# Patient Record
Sex: Female | Born: 1995 | Race: Black or African American | Hispanic: No | Marital: Single
Health system: Midwestern US, Community
[De-identification: ages and names within clinical notes are randomized; demographics above are authoritative.]

## PROBLEM LIST (undated history)

## (undated) ENCOUNTER — Ambulatory Visit (HOSPITAL_BASED_OUTPATIENT_CLINIC_OR_DEPARTMENT_OTHER)

## (undated) DIAGNOSIS — R519 Headache, unspecified: Secondary | ICD-10-CM

## (undated) DIAGNOSIS — D649 Anemia, unspecified: Secondary | ICD-10-CM

## (undated) DIAGNOSIS — F419 Anxiety disorder, unspecified: Secondary | ICD-10-CM

## (undated) DIAGNOSIS — R87629 Unspecified abnormal cytological findings in specimens from vagina: Secondary | ICD-10-CM

## (undated) DIAGNOSIS — F32A Depression, unspecified: Secondary | ICD-10-CM

## (undated) DIAGNOSIS — O26892 Other specified pregnancy related conditions, second trimester: Secondary | ICD-10-CM

## (undated) DIAGNOSIS — O26893 Other specified pregnancy related conditions, third trimester: Secondary | ICD-10-CM

## (undated) DIAGNOSIS — O4403 Placenta previa specified as without hemorrhage, third trimester: Secondary | ICD-10-CM

## (undated) HISTORY — DX: Headache, unspecified: R51.9

## (undated) HISTORY — DX: Anemia, unspecified: D64.9

## (undated) HISTORY — DX: Depression, unspecified: F32.A

## (undated) HISTORY — DX: Unspecified abnormal cytological findings in specimens from vagina: R87.629

## (undated) HISTORY — DX: Anxiety disorder, unspecified: F41.9

---

## 2017-12-11 ENCOUNTER — Inpatient Hospital Stay
Admit: 2017-12-11 | Discharge: 2017-12-12 | Payer: Self-pay | Attending: Student in an Organized Health Care Education/Training Program

## 2017-12-11 DIAGNOSIS — O26851 Spotting complicating pregnancy, first trimester: Secondary | ICD-10-CM

## 2017-12-11 NOTE — ED Notes (Signed)
Patient is [redacted] weeks pregnant with complains of cramping and spotting that started today.  Lmp was Sept 13.

## 2017-12-11 NOTE — ED Provider Notes (Signed)
22 year old [id number] pregnant female at 7 weeks and 2 days presents via private auto for complaint of pelvic cramping and vaginal bleeding.  She reports bleeding started today and has been intermittent.  She reports associated pelvic pain that she describes as "mild cramping" located in the suprapubic region.  She reports severity of pain as "7/10" currently and "7/10" at its worst.  She reports vaginal bleeding described as "light pink spotting."  She reports onset of symptoms as gradual, and reports bleeding has been intermittent.      She reports symptoms are aggravated by nothing.  She reports symptoms are improved with nothing.  She denies urinary symptoms.  She denies any associated fever, nausea, vomiting, abdominal pain, chest pain, vaginal discharge, vaginal lesions, or vaginal itching.    She reports one prior pregnancy that ended in miscarriage in the first trimester.  She denies prior sexually transmitted infections.  She denies prior pelvic inflammatory disease.  She denies fertility treatments.  She denies prior ectopic pregnancy.             No past medical history on file.    No past surgical history on file.      No family history on file.    Social History     Socioeconomic History   ??? Marital status: Not on file     Spouse name: Not on file   ??? Number of children: Not on file   ??? Years of education: Not on file   ??? Highest education level: Not on file   Occupational History   ??? Not on file   Social Needs   ??? Financial resource strain: Not on file   ??? Food insecurity:     Worry: Not on file     Inability: Not on file   ??? Transportation needs:     Medical: Not on file     Non-medical: Not on file   Tobacco Use   ??? Smoking status: Not on file   Substance and Sexual Activity   ??? Alcohol use: Not on file   ??? Drug use: Not on file   ??? Sexual activity: Not on file   Lifestyle   ??? Physical activity:     Days per week: Not on file     Minutes per session: Not on file   ??? Stress: Not on file    Relationships   ??? Social connections:     Talks on phone: Not on file     Gets together: Not on file     Attends religious service: Not on file     Active member of club or organization: Not on file     Attends meetings of clubs or organizations: Not on file     Relationship status: Not on file   ??? Intimate partner violence:     Fear of current or ex partner: Not on file     Emotionally abused: Not on file     Physically abused: Not on file     Forced sexual activity: Not on file   Other Topics Concern   ??? Not on file   Social History Narrative   ??? Not on file         ALLERGIES: Sulfa (sulfonamide antibiotics)    Review of Systems   Constitutional: Negative for chills, fatigue and fever.   HENT: Negative for congestion, rhinorrhea, sinus pressure, sinus pain and sore throat.    Eyes: Negative for pain, redness and visual disturbance.  Respiratory: Negative for cough and shortness of breath.    Cardiovascular: Negative for chest pain, palpitations and leg swelling.   Gastrointestinal: Negative for abdominal pain, constipation, diarrhea, nausea and vomiting.   Genitourinary: Positive for pelvic pain and vaginal bleeding. Negative for difficulty urinating, dysuria, flank pain, frequency, hematuria, vaginal discharge and vaginal pain.   Musculoskeletal: Negative for back pain and gait problem.   Skin: Negative for color change and rash.   Neurological: Negative for weakness, numbness and headaches.   Psychiatric/Behavioral: Negative for confusion.       Vitals:    12/11/17 1848 12/11/17 1903   BP:  114/64   Pulse:  70   Resp:  18   Temp:  99.2 ??F (37.3 ??C)   SpO2:  100%   Weight: 63.5 kg (140 lb)    Height: 5\' 4"  (1.626 m)             Physical Exam   Constitutional: She is oriented to person, place, and time. She appears well-developed and well-nourished. No distress.   HENT:   Head: Normocephalic and atraumatic.   Nose: Nose normal.   Mouth/Throat: Oropharynx is clear and moist.    Eyes: Pupils are equal, round, and reactive to light. Conjunctivae and EOM are normal. No scleral icterus.   Neck: Normal range of motion. Neck supple.   Cardiovascular: Normal rate and regular rhythm.   No murmur heard.  Pulmonary/Chest: Effort normal and breath sounds normal. No respiratory distress.   Abdominal: Soft. Bowel sounds are normal. She exhibits no distension. There is no tenderness.   Musculoskeletal: She exhibits no edema or deformity.   Neurological: She is alert and oriented to person, place, and time.   Skin: Skin is warm and dry. Capillary refill takes less than 2 seconds. No rash noted.   Nursing note and vitals reviewed.     Results Review    No results found for this or any previous visit (from the past 12 hour(s)).    MDM  Number of Diagnoses or Management Options  Diagnosis management comments: This is a 22 year old [id number] pregnant female at 7 weeks and 2 days who presented for complaint of mild pelvic cramping and vaginal bleeding she described as spotting who eloped prior to completion of her physical exam.  We had discussed performing a pelvic exam to evaluate for open cervix, and performing ultrasound to evaluate her pregnancy.  Patient reports having had an ultrasound at her OB appointment 3 days prior that demonstrated an intrauterine pregnancy.  Ectopic pregnancy was felt to be unlikely given her previously noted intrauterine pregnancy, but we were unable to further evaluate due to her having eloped from the ED prior to completion of medical examination.

## 2017-12-11 NOTE — ED Notes (Signed)
Patient not in room, charge nurse made aware.

## 2017-12-11 NOTE — ED Triage Notes (Signed)
Patient is [redacted] weeks pregnant with complains of cramping and spotting that started today.  Lmp was Sept 13.

## 2017-12-11 NOTE — ED Provider Notes (Signed)
22 year old [id number] pregnant female at 7 weeks and 2 days presents via private auto for complaint of pelvic cramping and vaginal bleeding.  She reports bleeding started today and has been intermittent.  She reports associated pelvic pain that she describes as "mild cramping" located in the suprapubic region.  She reports severity of pain as "7/10" currently and "7/10" at its worst.  She reports vaginal bleeding described as "light pink spotting."  She reports onset of symptoms as gradual, and reports bleeding has been intermittent.      She reports symptoms are aggravated by nothing.  She reports symptoms are improved with nothing.  She denies urinary symptoms.  She denies any associated fever, nausea, vomiting, abdominal pain, chest pain, vaginal discharge, vaginal lesions, or vaginal itching.    She reports one prior pregnancy that ended in miscarriage in the first trimester.  She denies prior sexually transmitted infections.  She denies prior pelvic inflammatory disease.  She denies fertility treatments.  She denies prior ectopic pregnancy.             No past medical history on file.    No past surgical history on file.      No family history on file.    Social History     Socioeconomic History   ??? Marital status: Not on file     Spouse name: Not on file   ??? Number of children: Not on file   ??? Years of education: Not on file   ??? Highest education level: Not on file   Occupational History   ??? Not on file   Social Needs   ??? Financial resource strain: Not on file   ??? Food insecurity:     Worry: Not on file     Inability: Not on file   ??? Transportation needs:     Medical: Not on file     Non-medical: Not on file   Tobacco Use   ??? Smoking status: Not on file   Substance and Sexual Activity   ??? Alcohol use: Not on file   ??? Drug use: Not on file   ??? Sexual activity: Not on file   Lifestyle   ??? Physical activity:     Days per week: Not on file     Minutes per session: Not on file   ??? Stress: Not on file   Relationships    ??? Social connections:     Talks on phone: Not on file     Gets together: Not on file     Attends religious service: Not on file     Active member of club or organization: Not on file     Attends meetings of clubs or organizations: Not on file     Relationship status: Not on file   ??? Intimate partner violence:     Fear of current or ex partner: Not on file     Emotionally abused: Not on file     Physically abused: Not on file     Forced sexual activity: Not on file   Other Topics Concern   ??? Not on file   Social History Narrative   ??? Not on file         ALLERGIES: Sulfa (sulfonamide antibiotics)    Review of Systems   Constitutional: Negative for chills, fatigue and fever.   HENT: Negative for congestion, rhinorrhea, sinus pressure, sinus pain and sore throat.    Eyes: Negative for pain, redness and visual disturbance.  Respiratory: Negative for cough and shortness of breath.    Cardiovascular: Negative for chest pain, palpitations and leg swelling.   Gastrointestinal: Negative for abdominal pain, constipation, diarrhea, nausea and vomiting.   Genitourinary: Positive for pelvic pain and vaginal bleeding. Negative for difficulty urinating, dysuria, flank pain, frequency, hematuria, vaginal discharge and vaginal pain.   Musculoskeletal: Negative for back pain and gait problem.   Skin: Negative for color change and rash.   Neurological: Negative for weakness, numbness and headaches.   Psychiatric/Behavioral: Negative for confusion.       Vitals:    12/11/17 1848 12/11/17 1903   BP:  114/64   Pulse:  70   Resp:  18   Temp:  99.2 ??F (37.3 ??C)   SpO2:  100%   Weight: 63.5 kg (140 lb)    Height: 5\' 4"  (1.626 m)             Physical Exam   Constitutional: She is oriented to person, place, and time. She appears well-developed and well-nourished. No distress.   HENT:   Head: Normocephalic and atraumatic.   Nose: Nose normal.   Mouth/Throat: Oropharynx is clear and moist.   Eyes: Pupils are equal, round, and reactive to  light. Conjunctivae and EOM are normal. No scleral icterus.   Neck: Normal range of motion. Neck supple.   Cardiovascular: Normal rate and regular rhythm.   No murmur heard.  Pulmonary/Chest: Effort normal and breath sounds normal. No respiratory distress.   Abdominal: Soft. Bowel sounds are normal. She exhibits no distension. There is no tenderness.   Musculoskeletal: She exhibits no edema or deformity.   Neurological: She is alert and oriented to person, place, and time.   Skin: Skin is warm and dry. Capillary refill takes less than 2 seconds. No rash noted.   Nursing note and vitals reviewed.     Results Review    No results found for this or any previous visit (from the past 12 hour(s)).    MDM  Number of Diagnoses or Management Options  Diagnosis management comments: This is a 22 year old [id number] pregnant female at 7 weeks and 2 days who presented for complaint of mild pelvic cramping and vaginal bleeding she described as spotting who eloped prior to completion of her physical exam.  We had discussed performing a pelvic exam to evaluate for open cervix, and performing ultrasound to evaluate her pregnancy.  Patient reports having had an ultrasound at her OB appointment 3 days prior that demonstrated an intrauterine pregnancy.  Ectopic pregnancy was felt to be unlikely given her previously noted intrauterine pregnancy, but we were unable to further evaluate due to her having eloped from the ED prior to completion of medical examination.

## 2017-12-11 NOTE — ED Notes (Signed)
Patient not in room, charge nurse made aware.

## 2017-12-24 ENCOUNTER — Encounter: Primary: Internal Medicine

## 2017-12-25 ENCOUNTER — Encounter: Primary: Internal Medicine

## 2017-12-29 LAB — RUBELLA AB, IGG, EXTERNAL
RUBELLA, EXTERNAL: IMMUNE
Rubella, External: IMMUNE

## 2017-12-29 LAB — ANTIBODY SCREEN, EXTERNAL
ANTIBODY SCREEN, EXTERNAL: NEGATIVE
Antibody screen, External: NEGATIVE

## 2017-12-29 LAB — RPR, EXTERNAL

## 2017-12-29 LAB — N GONORRHOEAE, DNA PROBE, EXTERNAL
Gonorrhea, External: NEGATIVE
N. GONORRHEA, EXTERNAL: NEGATIVE

## 2017-12-29 LAB — HEPATITIS B SURFACE AG, EXTERNAL
HBSAG, EXTERNAL: NEGATIVE
HBsAg, External: NEGATIVE

## 2017-12-29 LAB — TYPE, ABO & RH, EXTERNAL

## 2017-12-29 LAB — CHLAMYDIA DNA PROBE, EXTERNAL
CHLAMYDIA, EXTERNAL: NEGATIVE
Chlamydia, External: NEGATIVE

## 2017-12-29 LAB — HIV-1 AB, EXTERNAL

## 2018-04-04 ENCOUNTER — Inpatient Hospital Stay: Payer: MEDICAID

## 2018-04-04 NOTE — Other (Addendum)
1814 This G2P0 patietn of Dr. Elvis Coil ambulates to the unit at 23.[redacted] wk EGA with c/o lower abdominal pain and decreased fetal movement x 2 days ago. Patient denies vaginal bleeding or leaking of fluids. Denies headache, visual changes, epigastric pain, or sudden increase in swelling.     1816 Phone call to Dr.Jordan. Notified of patient arrival and complaint. Telephone order with read back for fetal heart rate, UA, and SVE. Dr.Jordan aware that patient has placenta previa. MD states "You are not going to go through the cervix I hope. You can gently check her. I need to know if she is dilated." Discussed with charge nurse and charge nurse agrees that patient should not have SVE. This RN calls provider back and notifies of this. Provider gives telephone order with read back to call her after UA results are back.    1905 Phone call to Dr.Jordan with negative UA results. No ctx's observed on tracing or with palpation. No vaginal bleeding noted by this RN or reported by patient. Patient does mention that she does a lot of bending and lifting at work which makes her very sore that night and into the following day. Patient is advised to obtain support belt and speak with Dr.Jordan at appt regarding this. Dr.Jordan also aware of this. Per telephone order w/ readback from Dr.Jordan patient to be d/c'd home at this time on pelvic rest and is to keep PB appt on Friday unless sxs worsen before that time.     1915 D/C instructions, fetal kick counts, round ligament pain, and follow up reviewed with patient and her mother. Written copy given. Patient denies questions/concerns at this time.     1919 Patient ambulates off unit at this time

## 2018-04-05 LAB — URINALYSIS W/ RFLX MICROSCOPIC
Bilirubin, Urine: NEGATIVE
Bilirubin: NEGATIVE
Blood, Urine: NEGATIVE
Blood: NEGATIVE
Glucose, Ur: NEGATIVE mg/dl
Glucose: NEGATIVE mg/dl
Ketone: NEGATIVE mg/dl
Ketones, Urine: NEGATIVE mg/dl
Nitrite, Urine: NEGATIVE
Nitrites: NEGATIVE
Protein, UA: NEGATIVE mg/dl
Protein: NEGATIVE mg/dl
Specific Gravity, UA: 1.02 (ref 1.005–1.030)
Specific gravity: 1.02 (ref 1.005–1.030)
Urobilinogen, UA, POCT: 0.2 mg/dl (ref 0.0–1.0)
Urobilinogen: 0.2 mg/dl (ref 0.0–1.0)
pH (UA): 7 (ref 5.0–9.0)
pH, UA: 7 (ref 5.0–9.0)

## 2018-04-05 LAB — POC URINE MICROSCOPIC

## 2018-07-06 ENCOUNTER — Ambulatory Visit: Admit: 2018-07-06 | Payer: PRIVATE HEALTH INSURANCE | Primary: Internal Medicine

## 2018-07-06 ENCOUNTER — Inpatient Hospital Stay: Payer: PRIVATE HEALTH INSURANCE

## 2018-07-06 LAB — URINALYSIS W/ RFLX MICROSCOPIC
Bilirubin, Urine: NEGATIVE
Bilirubin: NEGATIVE
Blood, Urine: NEGATIVE
Blood: NEGATIVE
Glucose, Ur: NEGATIVE mg/dl
Glucose: NEGATIVE mg/dl
Ketone: NEGATIVE mg/dl
Ketones, Urine: NEGATIVE mg/dl
Nitrite, Urine: NEGATIVE
Nitrites: NEGATIVE
Protein, UA: NEGATIVE mg/dl
Protein: NEGATIVE mg/dl
Specific Gravity, UA: 1.015 (ref 1.005–1.030)
Specific gravity: 1.015 (ref 1.005–1.030)
Urobilinogen, UA, POCT: 0.2 mg/dl (ref 0.0–1.0)
Urobilinogen: 0.2 mg/dl (ref 0.0–1.0)
pH (UA): 7 (ref 5.0–9.0)
pH, UA: 7 (ref 5.0–9.0)

## 2018-07-06 LAB — POC URINE MICROSCOPIC

## 2018-07-06 MED ORDER — DEXTROSE 5%-LACTATED RINGERS IV
Freq: Once | INTRAVENOUS | Status: AC
Start: 2018-07-06 — End: 2018-07-06
  Administered 2018-07-06: 07:00:00 via INTRAVENOUS

## 2018-07-06 MED ORDER — DEXTROSE 5%-LACTATED RINGERS IV
INTRAVENOUS | Status: DC
Start: 2018-07-06 — End: 2018-07-06
  Administered 2018-07-06: 08:00:00 via INTRAVENOUS

## 2018-07-06 MED FILL — DEXTROSE 5%-LACTATED RINGERS IV: INTRAVENOUS | Qty: 1000

## 2018-07-06 NOTE — Progress Notes (Signed)
 Pt presents to triage c/o sharp lower abdominal cramping since 0000 complete previa due for recheck US . .  Pt is a G2 P0010  [redacted]w[redacted]d  unknown     Visit Vitals  Temp 98.8 F (37.1 C)   Ht 5' 4 (1.626 m)   Wt 78.9 kg (174 lb)   BMI 29.87 kg/m     Allergies   Allergen Reactions   . Sulfa (Sulfonamide Antibiotics) Rash     Patient Vitals for the past 4 hrs:   Mode Fetal Heart Rate Fetal Activity Variability Decelerations Accelerations Non Stress Test   07/06/18 0125 External 135 Present 6-25 BPM None Yes Reactive     Uterine Activity  Mode: External  Frequency (min): 1-7  Duration (sec): 70-80  Intensity: Mild  Uterine Resting Tone: Relaxed         DrGrimes provided SBAR report.   0122 New orders received. UA, transvaginaL US , IV bolus 1L D5LR, check pt if previa has moved.  0141 UA sent to lab.  0145-US  in room.  0153-Pt up to the bathroom.  0210-US  completed  0225 Pt up to the bathroom.  0330-Provider on his way to the bedside, discussed variable decel, after long ctx, increased pain after US  and UA results.  [id number]-Provider at the bedside reviewed FHT, plan to bolus D5LR, then may be discharged pt to keep appt in office.

## 2018-07-06 NOTE — Progress Notes (Addendum)
Pt presents to triage c/o sharp lower abdominal cramping since 0000 complete previa due for recheck US. .  Pt is a G2 P0010  [redacted]w[redacted]d  unknown     Visit Vitals  Temp 98.8 ??F (37.1 ??C)   Ht 5' 4" (1.626 m)   Wt 78.9 kg (174 lb)   BMI 29.87 kg/m??     Allergies   Allergen Reactions   ??? Sulfa (Sulfonamide Antibiotics) Rash     Patient Vitals for the past 4 hrs:   Mode Fetal Heart Rate Fetal Activity Variability Decelerations Accelerations Non Stress Test   07/06/18 0125 External 135 Present 6-25 BPM None Yes Reactive     Uterine Activity  Mode: External  Frequency (min): 1-7  Duration (sec): 70-80  Intensity: Mild  Uterine Resting Tone: Relaxed         DrGrimes provided SBAR report.   0122 New orders received. UA, transvaginaL US, IV bolus 1L D5LR, check pt if previa has moved.  0141 UA sent to lab.  0145-US in room.  0153-Pt up to the bathroom.  0210-US completed  0225 Pt up to the bathroom.  0330-Provider on his way to the bedside, discussed variable decel, after long ctx, increased pain after US and UA results.  00355-Provider at the bedside reviewed FHT, plan to bolus D5LR, then may be discharged pt to keep appt in office.

## 2018-07-06 NOTE — Progress Notes (Signed)
Pt discharged in ambulatory condition with written instructions and labor precautions provided. Pt given opportunity to ask questions and voice concerns and denies any at this time. Pt encouraged to return to OB dept with changes in condition requiring further treatment.

## 2018-07-06 NOTE — Progress Notes (Signed)
Antepartum Progress Note  Patient presented with sharp abdominal pain and cramping since midnight.  She has been fluid hydrated and bedside sonogram in hospital tonight reviews a posterior fundal marginal placenta 1.9cm from cervical os.  She denies any vaginal bleeding or loss of fluid.   seen, fetal heart rate and contraction pattern evaluated, patient examined.  No data found.    Physical Exam:  Cervical Exam:  0 cm dilated    50% effaced    -3 station    Presenting Part: cephalic  Membranes:  Intact  Uterine Activity: irritability currently  Fetal Heart Rate: Baseline: 140 per minute  Variability: moderate  Accelerations: yes    Assessment/Plan:  23 y/o G2P0010 @ 36.6 wks with marginal placenta and contractions  - complete 2nd bag of IV Fluids  - Discharge home   - pt will F/U at scheduled OB visit and Formal OB sonogram in office next week.

## 2018-07-06 NOTE — Progress Notes (Signed)
Pt here for c/o cramping.

## 2018-07-06 NOTE — Progress Notes (Signed)
Progress Notes by Elvera Maria, MD at 07/06/18 0406                Author: Elvera Maria, MD  Service: Obstetrics & Gynecology  Author Type: Physician       Filed: 07/06/18 0414  Date of Service: 07/06/18 0406  Status: Signed          Editor: Elvera Maria, MD (Physician)                                Antepartum Progress Note   Patient presented with sharp abdominal pain and cramping since midnight.  She has been fluid hydrated and bedside sonogram in hospital tonight reviews a posterior fundal marginal placenta  1.9cm from cervical os.  She denies any vaginal bleeding or loss of fluid.   seen, fetal heart rate and contraction pattern evaluated, patient examined.   No data found.      Physical Exam:   Cervical Exam:  0 cm dilated     50% effaced     -3 station     Presenting Part: cephalic   Membranes:  Intact   Uterine Activity: irritability currently   Fetal Heart Rate: Baseline: 140 per minute   Variability: moderate   Accelerations: yes      Assessment/Plan:   23 y/o G2P0010 @ 36.6 wks with marginal placenta and contractions   - complete 2nd bag of IV Fluids   - Discharge home    - pt will F/U at scheduled OB visit and Formal OB sonogram in office next week.

## 2018-07-09 ENCOUNTER — Inpatient Hospital Stay: Payer: PRIVATE HEALTH INSURANCE

## 2018-07-09 ENCOUNTER — Ambulatory Visit: Admit: 2018-07-09 | Payer: PRIVATE HEALTH INSURANCE | Primary: Internal Medicine

## 2018-07-09 LAB — ANTIBODY SCREEN
Antibody Screen: NEGATIVE
Antibody screen: NEGATIVE

## 2018-07-09 LAB — CBC WITH AUTO DIFFERENTIAL
Basophils %: 0.2 % (ref 0–3)
Eosinophils %: 0.4 % (ref 0–5)
Hematocrit: 34.9 % — ABNORMAL LOW (ref 37.0–50.0)
Hemoglobin: 11.2 gm/dl — ABNORMAL LOW (ref 13.0–17.2)
Immature Granulocytes %: 0.8 % (ref 0.0–3.0)
Lymphocytes %: 14.2 % — ABNORMAL LOW (ref 28–48)
MCH: 27.1 pg (ref 25.4–34.6)
MCHC: 32.1 gm/dl (ref 30.0–36.0)
MCV: 84.3 fL (ref 80.0–98.0)
MPV: 10.4 fL — ABNORMAL HIGH (ref 6.0–10.0)
Monocytes %: 7.5 % (ref 1–13)
Neutrophils %: 76.9 % — ABNORMAL HIGH (ref 34–64)
Nucleated RBCs: 0 (ref 0–0)
Platelets: 169 10*3/uL (ref 140–450)
RBC: 4.14 M/uL (ref 3.60–5.20)
RDW-SD: 43.1 (ref 36.4–46.3)
WBC: 13.9 10*3/uL — ABNORMAL HIGH (ref 4.0–11.0)

## 2018-07-09 LAB — URINALYSIS W/ RFLX MICROSCOPIC
Bilirubin, Urine: NEGATIVE
Bilirubin: NEGATIVE
Glucose, Ur: NEGATIVE mg/dl
Glucose: NEGATIVE mg/dl
Ketone: NEGATIVE mg/dl
Ketones, Urine: NEGATIVE mg/dl
Nitrite, Urine: NEGATIVE
Nitrites: NEGATIVE
Specific Gravity, UA: 1.015 (ref 1.005–1.030)
Specific gravity: 1.015 (ref 1.005–1.030)
Urobilinogen, UA, POCT: 0.2 mg/dl (ref 0.0–1.0)
Urobilinogen: 0.2 mg/dl (ref 0.0–1.0)
pH (UA): 7 (ref 5.0–9.0)
pH, UA: 7 (ref 5.0–9.0)

## 2018-07-09 LAB — POC URINE MICROSCOPIC

## 2018-07-09 LAB — BLOOD TYPE, (ABO+RH)
ABO/Rh(D): O POS
ABO/Rh: O POS

## 2018-07-09 LAB — CBC WITH AUTOMATED DIFF
BASOPHILS: 0.2 % (ref 0–3)
EOSINOPHILS: 0.4 % (ref 0–5)
HCT: 34.9 % — ABNORMAL LOW (ref 37.0–50.0)
HGB: 11.2 gm/dl — ABNORMAL LOW (ref 13.0–17.2)
IMMATURE GRANULOCYTES: 0.8 % (ref 0.0–3.0)
LYMPHOCYTES: 14.2 % — ABNORMAL LOW (ref 28–48)
MCH: 27.1 pg (ref 25.4–34.6)
MCHC: 32.1 gm/dl (ref 30.0–36.0)
MCV: 84.3 fL (ref 80.0–98.0)
MONOCYTES: 7.5 % (ref 1–13)
MPV: 10.4 fL — ABNORMAL HIGH (ref 6.0–10.0)
NEUTROPHILS: 76.9 % — ABNORMAL HIGH (ref 34–64)
NRBC: 0 (ref 0–0)
PLATELET: 169 10*3/uL (ref 140–450)
RBC: 4.14 M/uL (ref 3.60–5.20)
RDW-SD: 43.1 (ref 36.4–46.3)
WBC: 13.9 10*3/uL — ABNORMAL HIGH (ref 4.0–11.0)

## 2018-07-09 MED ORDER — LACTATED RINGERS IV
INTRAVENOUS | Status: DC
Start: 2018-07-09 — End: 2018-07-09
  Administered 2018-07-09: 12:00:00 via INTRAVENOUS

## 2018-07-09 MED ORDER — ACETAMINOPHEN 1,000 MG/100 ML (10 MG/ML) IV
1000 mg/100 mL (10 mg/mL) | INTRAVENOUS | Status: AC
Start: 2018-07-09 — End: 2018-07-09
  Administered 2018-07-09: 11:00:00 via INTRAVENOUS

## 2018-07-09 MED ORDER — TERBUTALINE 1 MG/ML SUB-Q
1 mg/mL | SUBCUTANEOUS | Status: AC | PRN
Start: 2018-07-09 — End: 2018-07-09
  Administered 2018-07-09 (×2): via SUBCUTANEOUS

## 2018-07-09 MED FILL — OFIRMEV 1,000 MG/100 ML (10 MG/ML) INTRAVENOUS SOLUTION: 1000 mg/100 mL (10 mg/mL) | INTRAVENOUS | Qty: 100

## 2018-07-09 MED FILL — LACTATED RINGERS IV: INTRAVENOUS | Qty: 1000

## 2018-07-09 MED FILL — TERBUTALINE 1 MG/ML SUB-Q: 1 mg/mL | SUBCUTANEOUS | Qty: 1

## 2018-07-09 NOTE — Other (Addendum)
1240 EFM and TOCO removed. Patient transported to ultrasound at this time    1358 Patient returns to unit from ultrasound. BPP 8/8, posterior marginal placenta 2cm from internal os per report. LMOM for Dr.Butts to call unit for results    1410 Dr.Butts at bedside and discusses ultrasound results with patient and mother. Patient would like to have SVD vs C/S. SVE unchanged at this time. Patient scheduled for OB follow up in office tomorrow. Verbal order with read back recv'd to d/c home with labor precautions at this time.     1420 D/C instructions, labor precautions, fetal kick counts, and labor precautions reviewed with patient and mother. Written copy given to patient.     1430 Patient ambulates off unit at this time.

## 2018-07-09 NOTE — Other (Signed)
g2p0 at 37.2 wks arrived with c/o ctx that woke her up around 0430. Pt is a complete previa and was here 3 days ago for ctx as well but these are worse. Pt taken to 3115 and changed and voided. Pt then placed in high fowlers and monitors applied. Pt denies any vag bleeding. Admission assessment completed. Pt is very uncomfortable and breathing through some of her ctx. md to be called.

## 2018-07-09 NOTE — Other (Signed)
Dr Margie Ege called and made that this g2p0 at 37.2 wks arrived with c/o ctx. Pt is a complete previa and is ctxing q 2-3 min. No vag bleeding noted. Pt was here 3 days ago for the same reason and was hydrated and sent home. Orders rec'd to give the pt a dose of terb and may repeat times one. rn asked if he would like an iv and he stated "not at the moment".

## 2018-07-09 NOTE — Other (Signed)
Pt states that her ctx have spaced out a little bit, pt is still very uncomfortable and breathing through her ctx.

## 2018-07-09 NOTE — Other (Signed)
Dr butts called and made aware that this g2p0 at 37.2 wks complete previa arrived ctxing q3-4 min so dr Margie Ege gave her 2 doses of terb and she is now cting q 3-5 min. md asked where the rn got that the pt was a complete previa and this rn explained that the pt stated she was. md stated that the last note from dr grimes was that the pt had a marginal previa and she was to be scanned again tomorrow. md asked if the pt was in pain and rn explained that the pt is uncomfortable and breathing with some ctx. Orders rec'd to give a liter iv bolus and then to run at 125cc/hr and to send a urine, get a cbc, t&s and to give tylenol  1000mg  iv for pain. md will be in soon to see the pt.

## 2018-07-09 NOTE — Other (Signed)
Report given to dayshift rn

## 2018-07-09 NOTE — Other (Signed)
Dr.Butts at bedside for SVE, aware that patient has questionable placenta previa. SVE 0/50/-3

## 2018-07-09 NOTE — Other (Signed)
Phone call from Dr.Butts who states that she is unable to come to unit for repeat SVE at this time as planned. Telephone order with read back recvd for ultrasound for placenta placement and BPP.

## 2018-07-09 NOTE — H&P (Signed)
Obstetric Admission History and Physical    Name: Lauren Andrade MRN: [id number] SSN: [id number]    Date of Birth: May 14, 1995  Age: 23 y.o.  Sex: female       Subjective:      Chief complaint:    Chief Complaint   Patient presents with   ??? Contractions       Lauren Andrade is a 23 y.o.  female, G2P0 who presented with complaint of contractions. Pt complicated by complete previa noted previously in the pregnancy. She presented with same complaint 3 days ago and was found to have a marginal previa with 1.9 cm away from os noted on scan. Pt was fluid hydrated and discharged home. Notes today, contractions were more intense so she came back. She has not had any bleeding or leaking. She does note she is feeling baby move. After fluid hydration in antepartum today, her contractions are spaced and pt was able to rest. Repeat sonogram revealed placenta 2 cm away from os. Cervix closed x2 exam over 4 hours apart.     OB HISTORY  OB History     Gravida   2    Para        Term        Preterm        AB   1    Living           SAB   1    TAB        Ectopic        Molar        Multiple        Live Births                    PAST MEDICAL HISTORY  Past Medical History:   Diagnosis Date   ??? Placenta previa        PAST SURGICAL HISTORY  No past surgical history on file.    SOCIAL HISTORY  Social History     Socioeconomic History   ??? Marital status: SINGLE     Spouse name: Not on file   ??? Number of children: Not on file   ??? Years of education: 37   ??? Highest education level: Not on file   Occupational History   ??? Not on file   Social Needs   ??? Financial resource strain: Not on file   ??? Food insecurity     Worry: Not on file     Inability: Not on file   ??? Transportation needs     Medical: Not on file     Non-medical: Not on file   Tobacco Use   ??? Smoking status: Never Smoker   ??? Smokeless tobacco: Never Used   Substance and Sexual Activity   ??? Alcohol use: Not Currently     Frequency: Never   ??? Drug use: Never   ??? Sexual activity: Not on file    Lifestyle   ??? Physical activity     Days per week: Not on file     Minutes per session: Not on file   ??? Stress: Not on file   Relationships   ??? Social Wellsite geologist on phone: Not on file     Gets together: Not on file     Attends religious service: Not on file     Active member of club or organization: Not on file     Attends meetings of clubs or organizations: Not  on file     Relationship status: Not on file   ??? Intimate partner violence     Fear of current or ex partner: Not on file     Emotionally abused: Not on file     Physically abused: Not on file     Forced sexual activity: Not on file   Other Topics Concern   ??? Military Service Not Asked   ??? Blood Transfusions Not Asked   ??? Caffeine Concern Not Asked   ??? Occupational Exposure Not Asked   ??? Hobby Hazards Not Asked   ??? Sleep Concern Not Asked   ??? Stress Concern Not Asked   ??? Weight Concern Not Asked   ??? Special Diet Not Asked   ??? Back Care Not Asked   ??? Exercise Not Asked   ??? Bike Helmet Not Asked   ??? Seat Belt Not Asked   ??? Self-Exams Not Asked   Social History Narrative   ??? Not on file       FAMILY HISTORY  Family History   Problem Relation Age of Onset   ??? Hypertension Mother    ??? No Known Problems Father        ANTEPARTUM HISTORY: Pt presented to our practice for care initially in the first trimester. Prenatal course was uncomplicated/complicated by complete placenta previa which seems to have resolved.     ANTEPARTUM LABS:   Lab Results   Component Value Date/Time    ABO/Rh(D) O Rh Positive 07/09/2018 07:26 AM    HBsAg, External neg 12/29/2017    HIV, External nr 12/29/2017    Rubella, External immune 12/29/2017    RPR, External nr 12/29/2017    Gonorrhea, External neg 12/29/2017    Chlamydia, External neg 12/29/2017        ALLERGY:  Allergies   Allergen Reactions   ??? Sulfa (Sulfonamide Antibiotics) Rash       HOME MEDICATIONS:  Prior to Admission medications    Medication Sig Start Date End Date Taking? Authorizing Provider    iron,carb/vit C/vit B12/folic (IRON 100 PLUS PO) Take  by mouth.   Yes Provider, Historical   PNV No12-Iron-FA-DSS-OM-3 29 mg iron-1 mg -50 mg CPKD Take  by mouth.   Yes Provider, Historical   cholecalciferol (VITAMIN D3) (50,000 UNITS /1250 MCG) capsule Take  by mouth every seven (7) days.   Yes Provider, Historical        Review of Systems:  A comprehensive review of systems was negative except for: Constitutional:negative  Eyes: negative  Respiratory:negative  Cardiovascular: negative  Gastrointestinal: negative  Genitourinary: negative  Hematologic/lymphatic:negative  Musculoskeletal: negative  Neurological: negative  Behvioral/Psych: negative     Objective:     VITAL SIGNS:  Visit Vitals  BP 132/65   Pulse 91   Temp 98.3 ??F (36.8 ??C)   Resp 17   Ht 5\' 4"  (1.626 m)   Wt 78.9 kg (174 lb)   SpO2 99%   Breastfeeding No   BMI 29.87 kg/m??     Physical Exam:  Patient without distress.  Heart: normal rate  Lung: normal respiratory effort  Abdomen: soft, nontender, gravid uterus, ctx palpate moderate  External Genitalia: normal general appearance and normal general appearance without lesions  Cervix: Position: mid position, Dilation: 0cm and Thickness: 50%, -3 station  Extremities: extremities normal, atraumatic, no cyanosis or edema  Skin: Skin color, texture, turgor normal. No rashes or lesions  Neurologic: Alert and oriented X 3, normal strength and tone. Normal symmetric  reflexes. Normal coordination and gait    FHT: 150s bpm, cat1, ctx 2-3/10    Sonogram: 07/09/2018: FINDINGS:  Cervical length is approximately 3.3 cm. Placenta is posterior, 2 cm from the  internal os. Single intrauterine pregnancy, cephalic presentation. There is a  nuchal cord. Bladder, kidneys and stomach are unremarkable.  Fetal cardiac  activity is 143 bpm. Amniotic fluid index 14.3 cm, single deepest pocket 6.1 cm.  ??  Fetal tone 2  Fetal breathing 2  Fetal movement 2  Amniotic fluid volume 2  Total biophysical profile 8.  ??  IMPRESSION   IMPRESSION:  1. Marginal placenta, 2 cm from the internal os.  2. Suggestion of nuchal cord.  3. Total biophysical profile 8.    Assessment:     1) 37 weeks Intrauterine Pregnancy .  2) h/o marginal placenta previa      Plan:     Reassuring fetal status. Pt not in labor.   Repeat sonogram today confirms placenta previa resolved, noted to be 2 cm away from os.   Discussed finding with patient and her mothered. Reviewed that at this point, we have the option to proceed with vaginal delivery as placenta has moved. Discussed that since it is just on the border of normal, recommend continuing with same monitoring for vaginal bleeding. Discussed that at any point during labor, we may need to convert back to plan for c-section if she is noted to have bleeding that is deemed more than expected or concerning for danger to mother or baby. Pt voiced understanding and would like to proceed with this option as well. Precautions for labor reviewed. Pt will present for her OB visit in office tomorrow.       Signed By:  Murriel Hopper, MD     July 09, 2018   12:08 PM

## 2018-07-09 NOTE — H&P (Signed)
H&P by Murriel Hopper,  MD at 07/09/18 8469                Author: Murriel Hopper, MD  Service: Obstetrics & Gynecology  Author Type: Physician       Filed: 07/09/18 1745  Date of Service: 07/09/18 0815  Status: Signed          Editor: Murriel Hopper, MD (Physician)                             Obstetric Admission History and Physical          Name: Lauren Andrade  MRN: [id number]  SSN: [id number]          Date of Birth: [birth year]   Age: 23 y.o.   Sex: female            Subjective:         Chief complaint:       Chief Complaint       Patient presents with        ?  Contractions           Lauren Andrade is a 23 y.o.   female, G2P0 who presented with complaint of contractions. Pt complicated by complete previa noted previously in the pregnancy. She presented with same complaint 3 days ago and was found to have a marginal  previa with 1.9 cm away from os noted on scan. Pt was fluid hydrated and discharged home. Notes today, contractions were more intense so she came back. She has not had any bleeding or leaking. She does note she is feeling baby move. After fluid hydration  in antepartum today, her contractions are spaced and pt was able to rest. Repeat sonogram revealed placenta 2 cm away from os. Cervix closed x2 exam over 4 hours apart.       OB HISTORY     OB History                Gravida      2                Para                       Term                       Preterm                       AB      1                Living                                    SAB      1                TAB                       Ectopic                       Molar                       Multiple  Live Births                                           PAST MEDICAL HISTORY     Past Medical History:        Diagnosis  Date         ?  Placenta previa             PAST SURGICAL HISTORY   No past surgical history on file.      SOCIAL HISTORY     Social History          Socioeconomic History         ?  Marital status:  SINGLE               Spouse name:  Not on file         ?  Number of children:  Not on file     ?  Years of education:  112     ?  Highest education level:  Not on file       Occupational History        ?  Not on file       Social Needs         ?  Financial resource strain:  Not on file        ?  Food insecurity              Worry:  Not on file         Inability:  Not on file        ?  Transportation needs              Medical:  Not on file         Non-medical:  Not on file       Tobacco Use         ?  Smoking status:  Never Smoker     ?  Smokeless tobacco:  Never Used       Substance and Sexual Activity         ?  Alcohol use:  Not Currently              Frequency:  Never         ?  Drug use:  Never     ?  Sexual activity:  Not on file       Lifestyle        ?  Physical activity              Days per week:  Not on file         Minutes per session:  Not on file         ?  Stress:  Not on file       Relationships        ?  Social Engineer, manufacturing systemsconnections              Talks on phone:  Not on file         Gets together:  Not on file         Attends religious service:  Not on file         Active member of club or organization:  Not on file         Attends meetings of clubs or organizations:  Not on file  Relationship status:  Not on file        ?  Intimate partner violence              Fear of current or ex partner:  Not on file         Emotionally abused:  Not on file         Physically abused:  Not on file         Forced sexual activity:  Not on file        Other Topics  Concern         ?  Military Service  Not Asked     ?  Blood Transfusions  Not Asked     ?  Caffeine Concern  Not Asked     ?  Occupational Exposure  Not Asked     ?  Hobby Hazards  Not Asked     ?  Sleep Concern  Not Asked     ?  Stress Concern  Not Asked     ?  Weight Concern  Not Asked     ?  Special Diet  Not Asked     ?  Back Care  Not Asked     ?  Exercise  Not Asked     ?  Bike Helmet  Not Asked     ?  Seat Belt  Not Asked     ?  Self-Exams  Not Asked       Social  History Narrative        ?  Not on file           FAMILY HISTORY     Family History         Problem  Relation  Age of Onset          ?  Hypertension  Mother            ?  No Known Problems  Father             ANTEPARTUM HISTORY: Pt presented to our practice for care initially in the first trimester. Prenatal course was uncomplicated/complicated by complete placenta previa which seems to have resolved.       ANTEPARTUM LABS:      Lab Results         Component  Value  Date/Time            ABO/Rh(D)  O Rh Positive  07/09/2018 07:26 AM       HBsAg, External  neg  12/29/2017       HIV, External  nr  12/29/2017       Rubella, External  immune  12/29/2017       RPR, External  nr  12/29/2017       Gonorrhea, External  neg  12/29/2017            Chlamydia, External  neg  12/29/2017            ALLERGY:     Allergies        Allergen  Reactions         ?  Sulfa (Sulfonamide Antibiotics)  Rash           HOME MEDICATIONS:     Prior to Admission medications             Medication  Sig  Start Date  End Date  Taking?  Authorizing Provider  iron,carb/vit C/vit B12/folic (IRON 100 PLUS PO)  Take  by mouth.      Yes  Provider, Historical     PNV No12-Iron-FA-DSS-OM-3 29 mg iron-1 mg -50 mg CPKD  Take  by mouth.      Yes  Provider, Historical            cholecalciferol (VITAMIN D3) (50,000 UNITS /1250 MCG) capsule  Take  by mouth every seven (7) days.      Yes  Provider, Historical            Review of Systems:   A comprehensive review of systems was negative except for: Constitutional:negative   Eyes: negative   Respiratory:negative   Cardiovascular: negative   Gastrointestinal: negative   Genitourinary: negative   Hematologic/lymphatic:negative   Musculoskeletal: negative   Neurological: negative   Behvioral/Psych: negative         Objective:        VITAL SIGNS:   Visit Vitals      BP  132/65     Pulse  91     Temp  98.3 ??F (36.8 ??C)     Resp  17     Ht   (1.626 m)     Wt  78.9 kg (174 lb)     SpO2  99%      Breastfeeding  No        BMI  29.87 kg/m??        Physical Exam:   Patient without distress.   Heart: normal rate   Lung: normal respiratory effort   Abdomen: soft, nontender, gravid uterus, ctx palpate moderate   External Genitalia: normal general appearance and normal general appearance without lesions   Cervix: Position: mid position, Dilation: 0cm and Thickness: 50%, -3 station   Extremities: extremities normal, atraumatic, no cyanosis or edema   Skin: Skin color, texture, turgor normal. No rashes or lesions   Neurologic: Alert and oriented X 3, normal strength and tone. Normal symmetric reflexes. Normal coordination and gait      FHT: 150s bpm, cat1, ctx 2-3/10      Sonogram: 07/09/2018: FINDINGS:   Cervical length is approximately 3.3 cm. Placenta is posterior, 2 cm from the   internal os. Single intrauterine pregnancy, cephalic presentation. There is a   nuchal cord. Bladder, kidneys and stomach are unremarkable.  Fetal cardiac   activity is 143 bpm. Amniotic fluid index 14.3 cm, single deepest pocket 6.1 cm.   ??   Fetal tone 2   Fetal breathing 2   Fetal movement 2   Amniotic fluid volume 2   Total biophysical profile 8.   ??   IMPRESSION   IMPRESSION:   1. Marginal placenta, 2 cm from the internal os.   2. Suggestion of nuchal cord.   3. Total biophysical profile 8.        Assessment:        1) 37 weeks Intrauterine Pregnancy .   2) h/o marginal placenta previa           Plan:        Reassuring fetal status. Pt not in labor.    Repeat sonogram today confirms placenta previa resolved, noted to be 2 cm away from os.    Discussed finding with patient and her mothered. Reviewed that at this point, we have the option to proceed with vaginal delivery as placenta has moved. Discussed that since it is just on the border of normal, recommend continuing with same  monitoring  for vaginal bleeding. Discussed that at any point during labor, we may need to convert back to plan for c-section if she is noted to have bleeding  that is deemed more than expected or concerning for danger to mother or baby. Pt voiced understanding and  would like to proceed with this option as well. Precautions for labor reviewed. Pt will present for her OB visit in office tomorrow.             Signed By:   Murriel Hopper, MD           July 09, 2018    12:08 PM

## 2018-07-10 ENCOUNTER — Inpatient Hospital Stay
Admit: 2018-07-10 | Discharge: 2018-07-12 | Disposition: A | Discharge status: Home / Self Care | Payer: PRIVATE HEALTH INSURANCE | Attending: Obstetrics & Gynecology | Admitting: Obstetrics & Gynecology

## 2018-07-10 ENCOUNTER — Inpatient Hospital Stay: Admit: 2018-07-10 | Payer: PRIVATE HEALTH INSURANCE | Primary: Internal Medicine

## 2018-07-10 DIAGNOSIS — O4413 Placenta previa with hemorrhage, third trimester: Secondary | ICD-10-CM

## 2018-07-10 LAB — COMPREHENSIVE METABOLIC PANEL
ALT: 19 U/L (ref 12–78)
AST: 41 U/L — ABNORMAL HIGH (ref 15–37)
Albumin: 2.7 gm/dl — ABNORMAL LOW (ref 3.4–5.0)
Alkaline Phosphatase: 125 U/L — ABNORMAL HIGH (ref 45–117)
Anion Gap: 5 mmol/L (ref 5–15)
BUN: 4 mg/dl — ABNORMAL LOW (ref 7–25)
CO2: 21 mEq/L (ref 21–32)
Calcium: 8.9 mg/dl (ref 8.5–10.1)
Chloride: 111 mEq/L — ABNORMAL HIGH (ref 98–107)
Creatinine: 0.8 mg/dl (ref 0.6–1.3)
GFR African American: >60
Glucose: 94 mg/dl (ref 74–106)
Potassium: 4 mEq/L (ref 3.5–5.1)
Sodium: 137 mEq/L (ref 136–145)
Total Bilirubin: 0.7 mg/dl (ref 0.2–1.0)
Total Protein: 6.4 gm/dl (ref 6.4–8.2)
eGFR NON-AA: >60

## 2018-07-10 LAB — CBC WITH AUTO DIFFERENTIAL
Basophils %: 0.3 % (ref 0–3)
Eosinophils %: 0.2 % (ref 0–5)
Hematocrit: 35.1 % — ABNORMAL LOW (ref 37.0–50.0)
Hemoglobin: 11.4 gm/dl — ABNORMAL LOW (ref 13.0–17.2)
Immature Granulocytes %: 0.5 % (ref 0.0–3.0)
Lymphocytes %: 8.1 % — ABNORMAL LOW (ref 28–48)
MCH: 27.2 pg (ref 25.4–34.6)
MCHC: 32.5 gm/dl (ref 30.0–36.0)
MCV: 83.8 fL (ref 80.0–98.0)
MPV: 10.1 fL — ABNORMAL HIGH (ref 6.0–10.0)
Monocytes %: 7 % (ref 1–13)
Neutrophils %: 83.9 % — ABNORMAL HIGH (ref 34–64)
Nucleated RBCs: 0 (ref 0–0)
Platelets: 183 10*3/uL (ref 140–450)
RBC: 4.19 M/uL (ref 3.60–5.20)
RDW-SD: 43.1 (ref 36.4–46.3)
WBC: 17.1 10*3/uL — ABNORMAL HIGH (ref 4.0–11.0)

## 2018-07-10 LAB — RBC, 2 UNIT CROSSMATCH
BLOOD TYPE CODE: 5100
BLOOD TYPE, UT02: 5100
Blood type code: 5100
Blood type code: 5100
SPECIMEN EXPIRATION DATE, SE02: 202006052359
SPECIMEN EXPIRATION DATE: 202006052359
Specimen expiration date: 202006052359
Specimen expiration date: 202006052359

## 2018-07-10 LAB — CBC
Hematocrit: 33.7 % — ABNORMAL LOW (ref 37.0–50.0)
Hemoglobin: 10.9 gm/dl — ABNORMAL LOW (ref 13.0–17.2)
MCH: 27.7 pg (ref 25.4–34.6)
MCHC: 32.3 gm/dl (ref 30.0–36.0)
MCV: 85.5 fL (ref 80.0–98.0)
MPV: 10.7 fL — ABNORMAL HIGH (ref 6.0–10.0)
Platelets: 191 10*3/uL (ref 140–450)
RBC: 3.94 M/uL (ref 3.60–5.20)
RDW-SD: 43.6 (ref 36.4–46.3)
WBC: 19.1 10*3/uL — ABNORMAL HIGH (ref 4.0–11.0)

## 2018-07-10 LAB — BLOOD TYPE, (ABO+RH)
ABO/Rh(D): O POS
ABO/Rh: O POS

## 2018-07-10 LAB — COVID-19: COVID-19: NEGATIVE

## 2018-07-10 LAB — ANTIBODY SCREEN
Antibody Screen: NEGATIVE
Antibody screen: NEGATIVE

## 2018-07-10 LAB — METABOLIC PANEL, COMPREHENSIVE
ALT (SGPT): 19 U/L (ref 12–78)
AST (SGOT): 41 U/L — ABNORMAL HIGH (ref 15–37)
Albumin: 2.7 gm/dl — ABNORMAL LOW (ref 3.4–5.0)
Alk. phosphatase: 125 U/L — ABNORMAL HIGH (ref 45–117)
Anion gap: 5 mmol/L (ref 5–15)
BUN: 4 mg/dl — ABNORMAL LOW (ref 7–25)
Bilirubin, total: 0.7 mg/dl (ref 0.2–1.0)
CO2: 21 mEq/L (ref 21–32)
Calcium: 8.9 mg/dl (ref 8.5–10.1)
Chloride: 111 mEq/L — ABNORMAL HIGH (ref 98–107)
Creatinine: 0.8 mg/dl (ref 0.6–1.3)
GFR est AA: >60
GFR est non-AA: >60
Glucose: 94 mg/dl (ref 74–106)
Potassium: 4 mEq/L (ref 3.5–5.1)
Protein, total: 6.4 gm/dl (ref 6.4–8.2)
Sodium: 137 mEq/L (ref 136–145)

## 2018-07-10 LAB — CBC WITH AUTOMATED DIFF
BASOPHILS: 0.3 % (ref 0–3)
EOSINOPHILS: 0.2 % (ref 0–5)
HCT: 35.1 % — ABNORMAL LOW (ref 37.0–50.0)
HGB: 11.4 gm/dl — ABNORMAL LOW (ref 13.0–17.2)
IMMATURE GRANULOCYTES: 0.5 % (ref 0.0–3.0)
LYMPHOCYTES: 8.1 % — ABNORMAL LOW (ref 28–48)
MCH: 27.2 pg (ref 25.4–34.6)
MCHC: 32.5 gm/dl (ref 30.0–36.0)
MCV: 83.8 fL (ref 80.0–98.0)
MONOCYTES: 7 % (ref 1–13)
MPV: 10.1 fL — ABNORMAL HIGH (ref 6.0–10.0)
NEUTROPHILS: 83.9 % — ABNORMAL HIGH (ref 34–64)
NRBC: 0 (ref 0–0)
PLATELET: 183 10*3/uL (ref 140–450)
RBC: 4.19 M/uL (ref 3.60–5.20)
RDW-SD: 43.1 (ref 36.4–46.3)
WBC: 17.1 10*3/uL — ABNORMAL HIGH (ref 4.0–11.0)

## 2018-07-10 LAB — COVID-19 (INPATIENT TESTING): COVID-19: NEGATIVE

## 2018-07-10 LAB — CBC W/O DIFF
HCT: 33.7 % — ABNORMAL LOW (ref 37.0–50.0)
HGB: 10.9 gm/dl — ABNORMAL LOW (ref 13.0–17.2)
MCH: 27.7 pg (ref 25.4–34.6)
MCHC: 32.3 gm/dl (ref 30.0–36.0)
MCV: 85.5 fL (ref 80.0–98.0)
MPV: 10.7 fL — ABNORMAL HIGH (ref 6.0–10.0)
PLATELET: 191 10*3/uL (ref 140–450)
RBC: 3.94 M/uL (ref 3.60–5.20)
RDW-SD: 43.6 (ref 36.4–46.3)
WBC: 19.1 10*3/uL — ABNORMAL HIGH (ref 4.0–11.0)

## 2018-07-10 MED ORDER — HYDROMORPHONE IN NS 0.2 MG/ML PCA PARAMETERS IN ML
0.2 MG/ML | INTRAVENOUS | Status: DC
Start: 2018-07-10 — End: 2018-07-10
  Administered 2018-07-10 (×2): via INTRAVENOUS

## 2018-07-10 MED ORDER — KETOROLAC TROMETHAMINE 30 MG/ML INJECTION
30 mg/mL (1 mL) | Freq: Once | INTRAMUSCULAR | Status: DC | PRN
Start: 2018-07-10 — End: 2018-07-10

## 2018-07-10 MED ORDER — NALBUPHINE 10 MG/ML INJECTION
10 mg/mL | INTRAMUSCULAR | Status: AC | PRN
Start: 2018-07-10 — End: 2018-07-11

## 2018-07-10 MED ORDER — METHYLPREDNISOLONE (PF) 40 MG/ML IJ SOLR
40 mg/mL | INTRAMUSCULAR | Status: DC | PRN
Start: 2018-07-10 — End: 2018-07-10

## 2018-07-10 MED ORDER — CEFAZOLIN 2 GRAM/50 ML NS IVPB
INTRAVENOUS | Status: DC
Start: 2018-07-10 — End: 2018-07-10
  Administered 2018-07-10: 11:00:00

## 2018-07-10 MED ORDER — OXYTOCIN 20 UNITS/1000 ML IN NS
20 unit/1000 mL | INTRAVENOUS | Status: DC | PRN
Start: 2018-07-10 — End: 2018-07-10
  Administered 2018-07-10: 11:00:00 via INTRAVENOUS

## 2018-07-10 MED ORDER — HYDROMORPHONE IN NS 0.2 MG/ML PCA PARAMETERS IN ML
0.2 MG/ML | INTRAVENOUS | Status: DC
Start: 2018-07-10 — End: 2018-07-10
  Administered 2018-07-10: 12:00:00 via INTRAVENOUS

## 2018-07-10 MED ORDER — ACETAMINOPHEN 325 MG TABLET
325 mg | ORAL | Status: DC | PRN
Start: 2018-07-10 — End: 2018-07-10

## 2018-07-10 MED ORDER — ACETAMINOPHEN 1,000 MG/100 ML (10 MG/ML) IV
1000 mg/100 mL (10 mg/mL) | INTRAVENOUS | Status: DC | PRN
Start: 2018-07-10 — End: 2018-07-10
  Administered 2018-07-10: 11:00:00 via INTRAVENOUS

## 2018-07-10 MED ORDER — CEFAZOLIN 2 G IN 100 ML 0.9% NS
2 gram/100 mL | INTRAVENOUS | Status: DC | PRN
Start: 2018-07-10 — End: 2018-07-10
  Administered 2018-07-10: 11:00:00 via INTRAVENOUS

## 2018-07-10 MED ORDER — CARBOPROST TROMETHAMINE 250 MCG/ML IM SOLN
250 mcg/mL | INTRAMUSCULAR | Status: DC
Start: 2018-07-10 — End: 2018-07-10
  Administered 2018-07-10: 11:00:00 via INTRAMUSCULAR

## 2018-07-10 MED ORDER — ACETAMINOPHEN 325 MG TABLET
325 mg | ORAL | Status: DC | PRN
Start: 2018-07-10 — End: 2018-07-12

## 2018-07-10 MED ORDER — DIPHENHYDRAMINE HCL 50 MG/ML IJ SOLN
50 mg/mL | INTRAMUSCULAR | Status: DC | PRN
Start: 2018-07-10 — End: 2018-07-10

## 2018-07-10 MED ORDER — SIMETHICONE 80 MG CHEWABLE TAB
80 mg | Freq: Three times a day (TID) | ORAL | Status: DC | PRN
Start: 2018-07-10 — End: 2018-07-12

## 2018-07-10 MED ORDER — OXYTOCIN 10 UNIT/ML INJECTION
10 unit/mL | INTRAMUSCULAR | Status: DC | PRN
Start: 2018-07-10 — End: 2018-07-10
  Administered 2018-07-10 (×2): via INTRAVENOUS

## 2018-07-10 MED ORDER — DEXTROSE 5%-LACTATED RINGERS IV
INTRAVENOUS | Status: AC
Start: 2018-07-10 — End: 2018-07-11
  Administered 2018-07-10 – 2018-07-11 (×2): via INTRAVENOUS

## 2018-07-10 MED ORDER — GLYCERIN-WITCH HAZEL 12.5 %-50 % TOPICAL PADS
CUTANEOUS | Status: DC | PRN
Start: 2018-07-10 — End: 2018-07-12

## 2018-07-10 MED ORDER — DIPHENHYDRAMINE HCL 50 MG/ML IJ SOLN
50 mg/mL | Freq: Four times a day (QID) | INTRAMUSCULAR | Status: DC | PRN
Start: 2018-07-10 — End: 2018-07-12

## 2018-07-10 MED ORDER — GLYCOPYRROLATE 0.2 MG/ML IJ SOLN
0.2 mg/mL | INTRAMUSCULAR | Status: DC | PRN
Start: 2018-07-10 — End: 2018-07-10
  Administered 2018-07-10: 11:00:00 via INTRAVENOUS

## 2018-07-10 MED ORDER — DIPHENHYDRAMINE HCL 50 MG/ML IJ SOLN
50 mg/mL | INTRAMUSCULAR | Status: AC | PRN
Start: 2018-07-10 — End: 2018-07-11

## 2018-07-10 MED ORDER — PRENATAL VITAMIN WITHOUT CALCIUM #5-IRON-FA 106.5 MG-1 MG CAP
Freq: Every day | ORAL | Status: DC
Start: 2018-07-10 — End: 2018-07-12
  Administered 2018-07-11 – 2018-07-12 (×2): via ORAL

## 2018-07-10 MED ORDER — NALOXONE 0.4 MG/ML INJECTION
0.4 mg/mL | INTRAMUSCULAR | Status: DC | PRN
Start: 2018-07-10 — End: 2018-07-12

## 2018-07-10 MED ORDER — SUCCINYLCHOLINE CHLORIDE 20 MG/ML INJECTION
20 mg/mL | INTRAMUSCULAR | Status: DC | PRN
Start: 2018-07-10 — End: 2018-07-10
  Administered 2018-07-10: 11:00:00 via INTRAVENOUS

## 2018-07-10 MED ORDER — ACETAMINOPHEN 325 MG TABLET
325 mg | Freq: Four times a day (QID) | ORAL | Status: AC | PRN
Start: 2018-07-10 — End: 2018-07-11

## 2018-07-10 MED ORDER — METHYLERGONOVINE MALEATE 0.2 MG/ML IJ SOLN
0.2 mg/mL (1 mL) | INTRAMUSCULAR | Status: DC | PRN
Start: 2018-07-10 — End: 2018-07-10
  Administered 2018-07-10: 11:00:00 via INTRAMUSCULAR

## 2018-07-10 MED ORDER — ZOLPIDEM 5 MG TAB
5 mg | Freq: Every evening | ORAL | Status: DC | PRN
Start: 2018-07-10 — End: 2018-07-12

## 2018-07-10 MED ORDER — ACETAMINOPHEN 1,000 MG/100 ML (10 MG/ML) IV
1000 mg/100 mL (10 mg/mL) | Freq: Once | INTRAVENOUS | Status: DC
Start: 2018-07-10 — End: 2018-07-10

## 2018-07-10 MED ORDER — ONDANSETRON (PF) 4 MG/2 ML INJECTION
4 mg/2 mL | Freq: Once | INTRAMUSCULAR | Status: DC | PRN
Start: 2018-07-10 — End: 2018-07-10

## 2018-07-10 MED ORDER — MISOPROSTOL 200 MCG TAB
200 mcg | Freq: Once | ORAL | Status: AC | PRN
Start: 2018-07-10 — End: 2018-07-11

## 2018-07-10 MED ORDER — DEXAMETHASONE SODIUM PHOSPHATE 4 MG/ML IJ SOLN
4 mg/mL | INTRAMUSCULAR | Status: DC | PRN
Start: 2018-07-10 — End: 2018-07-10
  Administered 2018-07-10: 11:00:00 via INTRAVENOUS

## 2018-07-10 MED ORDER — SENNOSIDES 8.6 MG TAB
8.6 mg | Freq: Every evening | ORAL | Status: DC
Start: 2018-07-10 — End: 2018-07-12
  Administered 2018-07-12 (×2): via ORAL

## 2018-07-10 MED ORDER — KETOROLAC TROMETHAMINE 30 MG/ML INJECTION
30 mg/mL (1 mL) | INTRAMUSCULAR | Status: DC | PRN
Start: 2018-07-10 — End: 2018-07-10
  Administered 2018-07-10: 11:00:00 via INTRAVENOUS

## 2018-07-10 MED ORDER — PROMETHAZINE IN NS 6.25 MG/50 ML IV PIGGY BAG
6.25 mg/50 ml | INTRAVENOUS | Status: AC | PRN
Start: 2018-07-10 — End: 2018-07-11

## 2018-07-10 MED ORDER — DIPHENHYDRAMINE 25 MG CAP
25 mg | Freq: Four times a day (QID) | ORAL | Status: DC | PRN
Start: 2018-07-10 — End: 2018-07-12

## 2018-07-10 MED ORDER — PROMETHAZINE IN NS 6.25 MG/50 ML IV PIGGY BAG
6.25 mg/50 ml | INTRAVENOUS | Status: DC | PRN
Start: 2018-07-10 — End: 2018-07-10

## 2018-07-10 MED ORDER — ACETAMINOPHEN 1,000 MG/100 ML (10 MG/ML) IV
1000 mg/100 mL (10 mg/mL) | INTRAVENOUS | Status: AC
Start: 2018-07-10 — End: 2018-07-10

## 2018-07-10 MED ORDER — MORPHINE (PF) 0.5 MG/ML IJ SOLN
0.5 mg/mL | INTRAMUSCULAR | Status: AC
Start: 2018-07-10 — End: ?

## 2018-07-10 MED ORDER — KETOROLAC TROMETHAMINE 15 MG/ML INJECTION
15 mg/mL | Freq: Four times a day (QID) | INTRAMUSCULAR | Status: AC | PRN
Start: 2018-07-10 — End: 2018-07-11

## 2018-07-10 MED ORDER — BISACODYL 10 MG RECTAL SUPPOSITORY
10 mg | Freq: Every day | RECTAL | Status: DC | PRN
Start: 2018-07-10 — End: 2018-07-12

## 2018-07-10 MED ORDER — ONDANSETRON (PF) 4 MG/2 ML INJECTION
4 mg/2 mL | Freq: Four times a day (QID) | INTRAMUSCULAR | Status: AC | PRN
Start: 2018-07-10 — End: 2018-07-11

## 2018-07-10 MED ORDER — OCTYL 2-CYANOACRYLATE TOPICAL LIQUID
CUTANEOUS | Status: AC
Start: 2018-07-10 — End: 2018-07-10
  Administered 2018-07-10: 12:00:00 via TOPICAL

## 2018-07-10 MED ORDER — CARBOPROST TROMETHAMINE 250 MCG/ML IM SOLN
250 mcg/mL | Freq: Once | INTRAMUSCULAR | Status: AC
Start: 2018-07-10 — End: 2018-07-10

## 2018-07-10 MED ORDER — FENTANYL CITRATE (PF) 50 MCG/ML IJ SOLN
50 mcg/mL | INTRAMUSCULAR | Status: DC | PRN
Start: 2018-07-10 — End: 2018-07-10

## 2018-07-10 MED ORDER — ACETAMINOPHEN 650 MG RECTAL SUPPOSITORY
650 mg | RECTAL | Status: DC | PRN
Start: 2018-07-10 — End: 2018-07-10

## 2018-07-10 MED ORDER — FENTANYL CITRATE (PF) 50 MCG/ML IJ SOLN
50 mcg/mL | INTRAMUSCULAR | Status: DC | PRN
Start: 2018-07-10 — End: 2018-07-10
  Administered 2018-07-10 (×3): via INTRAVENOUS

## 2018-07-10 MED ORDER — IBUPROFEN 600 MG TAB
600 mg | Freq: Four times a day (QID) | ORAL | Status: AC | PRN
Start: 2018-07-10 — End: 2018-07-11
  Administered 2018-07-11: 01:00:00 via ORAL

## 2018-07-10 MED ORDER — SODIUM CHLORIDE 0.9 % IJ SYRG
INTRAMUSCULAR | Status: DC | PRN
Start: 2018-07-10 — End: 2018-07-12

## 2018-07-10 MED ORDER — OXYTOCIN 20 UNITS/1000 ML IN NS
20 unit/1000 mL | INTRAVENOUS | Status: AC
Start: 2018-07-10 — End: 2018-07-11
  Administered 2018-07-10: 13:00:00 via INTRAVENOUS

## 2018-07-10 MED ORDER — MORPHINE (PF) 1 MG/ML INJECTION
1 mg/mL | INTRAMUSCULAR | Status: DC | PRN
Start: 2018-07-10 — End: 2018-07-10
  Administered 2018-07-10: 11:00:00 via INTRAVENOUS

## 2018-07-10 MED ORDER — METHYLERGONOVINE MALEATE 0.2 MG/ML IJ SOLN
0.2 mg/mL (1 mL) | INTRAMUSCULAR | Status: DC
Start: 2018-07-10 — End: 2018-07-10

## 2018-07-10 MED ORDER — FENTANYL CITRATE (PF) 50 MCG/ML IJ SOLN
50 mcg/mL | INTRAMUSCULAR | Status: AC
Start: 2018-07-10 — End: ?

## 2018-07-10 MED ORDER — PROPOFOL 10 MG/ML IV EMUL
10 mg/mL | INTRAVENOUS | Status: DC | PRN
Start: 2018-07-10 — End: 2018-07-10
  Administered 2018-07-10: 11:00:00 via INTRAVENOUS

## 2018-07-10 MED ORDER — MORPHINE 2 MG/ML INJECTION
2 mg/mL | INTRAMUSCULAR | Status: DC | PRN
Start: 2018-07-10 — End: 2018-07-10

## 2018-07-10 MED ORDER — FUROSEMIDE 10 MG/ML IJ SOLN
10 mg/mL | INTRAMUSCULAR | Status: DC | PRN
Start: 2018-07-10 — End: 2018-07-10

## 2018-07-10 MED ORDER — SODIUM CHLORIDE 0.9 % IV
INTRAVENOUS | Status: DC | PRN
Start: 2018-07-10 — End: 2018-07-10

## 2018-07-10 MED ORDER — IBUPROFEN 600 MG TAB
600 mg | Freq: Four times a day (QID) | ORAL | Status: DC | PRN
Start: 2018-07-10 — End: 2018-07-12
  Administered 2018-07-11 – 2018-07-12 (×5): via ORAL

## 2018-07-10 MED ORDER — BENZOCAINE 20 % TOPICAL AEROSOL
20 % | CUTANEOUS | Status: DC | PRN
Start: 2018-07-10 — End: 2018-07-12

## 2018-07-10 MED ORDER — METHYLERGONOVINE MALEATE 0.2 MG/ML IJ SOLN
0.2 mg/mL (1 mL) | Freq: Once | INTRAMUSCULAR | Status: AC
Start: 2018-07-10 — End: 2018-07-10

## 2018-07-10 MED ORDER — OCTYL 2-CYANOACRYLATE TOPICAL LIQUID
Freq: Once | CUTANEOUS | Status: AC
Start: 2018-07-10 — End: 2018-07-10

## 2018-07-10 MED ORDER — HYDROCORTISONE 2.5 % RECTAL CREAM
2.5 % | CUTANEOUS | Status: DC | PRN
Start: 2018-07-10 — End: 2018-07-12

## 2018-07-10 MED FILL — METHYLERGONOVINE MALEATE 0.2 MG/ML IJ SOLN: 0.2 mg/mL (1 mL) | INTRAMUSCULAR | Qty: 1

## 2018-07-10 MED FILL — HYDROMORPHONE IN NS 0.2 MG/ML INFUSION: 0.2 mg/mL | INTRAVENOUS | Qty: 50

## 2018-07-10 MED FILL — HEMABATE 250 MCG/ML INTRAMUSCULAR SOLUTION: 250 mcg/mL | INTRAMUSCULAR | Qty: 1

## 2018-07-10 MED FILL — CEFAZOLIN 2 GRAM/50 ML NS IVPB: INTRAVENOUS | Qty: 50

## 2018-07-10 MED FILL — OFIRMEV 1,000 MG/100 ML (10 MG/ML) INTRAVENOUS SOLUTION: 1000 mg/100 mL (10 mg/mL) | INTRAVENOUS | Qty: 100

## 2018-07-10 MED FILL — SODIUM CHLORIDE 0.9 % IV: INTRAVENOUS | Qty: 250

## 2018-07-10 MED FILL — DEXTROSE 5%-LACTATED RINGERS IV: INTRAVENOUS | Qty: 1000

## 2018-07-10 MED FILL — MORPHINE (PF) 0.5 MG/ML IJ SOLN: 0.5 mg/mL | INTRAMUSCULAR | Qty: 10

## 2018-07-10 MED FILL — HYDROMORPHONE IN NS 0.2 MG/ML PCA PARAMETERS IN ML: 0.2 MG/ML | INTRAVENOUS | Qty: 50

## 2018-07-10 MED FILL — ACETAMINOPHEN 650 MG RECTAL SUPPOSITORY: 650 mg | RECTAL | Qty: 1

## 2018-07-10 MED FILL — SOLU-MEDROL (PF) 40 MG/ML SOLUTION FOR INJECTION: 40 mg/mL | INTRAMUSCULAR | Qty: 1

## 2018-07-10 MED FILL — OCTYLSEAL TOPICAL LIQUID: CUTANEOUS | Qty: 6

## 2018-07-10 MED FILL — FENTANYL CITRATE (PF) 50 MCG/ML IJ SOLN: 50 mcg/mL | INTRAMUSCULAR | Qty: 5

## 2018-07-10 NOTE — Op Note (Signed)
Op Notes by Murriel HopperButts,  Joany Khatib C, MD at 07/10/18 [phone]                Author: Murriel HopperButts, Lynore Coscia C, MD  Service: Obstetrics & Gynecology  Author Type: Physician       Filed: 07/10/18 0821  Date of Service: 07/10/18 0747  Status: Signed          Editor: Murriel HopperButts, Belen Zwahlen C, MD (Physician)                          Brief Post-Op Note          Patient: Lauren Andrade  MRN: [id number]   SSN: [id number]          Date of Birth: 26-Mar-1995   Age: 23 y.o.   Sex: female         MRN: [id number]      Surgeon(s): Murriel Hopperiera C Ereka Brau, MD      Assistant(s):  Althea Grimmerosado, MD; Adonis HugueninGabriele Hassan, SA      Pre-operative Diagnosis: Non-reassuring fetal heart tracing, Placental Abruption, Term pregnancy, Marginal placenta previa      Post-operative Diagnosis: Same as pre-operative diagnosis      Procedure(s) Performed: Procedure(s):   Primary Low Transverse cesarean section.       Anesthesia: general anesthesia      Findings: Vaginal bleeding, large clot in vaginal vault and cervix dilated 1 cm. Gravid uterus with single viable female infant weighing 2890g, APGARs 8/9 delivered through clear amniotic fluid. Normal  appearing uterus, bilateral fallopian tubes and ovaries.        Complications: None      Estimated Blood Loss:  700 ml      Tubes and Drains: Foley catheter with clear yellow urine.       Specimens:            ID  Type  Source  Tests  Collected by  Time  Destination             1 : Placenta IUP @ 37.3 wk  Organ  Placenta  [facility]  Julie Paolini, Elberta Fortisiera C, MD  07/10/2018 0700          Indications for the procedure: Ms. Lauren Andrade is a  23 y/o now G2P1011 at 4337.3 who presented for vaginal bleeding. Pt had a placenta previa which was resolving, noted to be 2 cm from os yesterday and opted for trial of labor. This morning, patient presented  after large gush of bleeding, found to have clot of ~30 cc cc in the vagina, cervix thick and 1 cm and FHT non-reassuring with period of minimal variability and tachycardia. Decision was made to proceed with stat cesarean section.     Risks and benefits of the procedure were discussed with the patient and verbal consent obtained.    The patient was then taken to the operating room. Time out was performed to confirm correct patient and correct procedure. She was splashed with betadine and draped in a sterile fashion  in the dorsal supine position with a leftward tilt of the hips..  General anesthesia was found to be adequate. A pfannenstiel skin incision was made with a scalpel and carried through to the underlying layer of fascia. The fascia was incised in the midline and the incision extended bluntly. There was blunt dissection  of fascia from the rectus abdominis muscles. The rectus muscles were separated in the midline, and the peritoneum identified, and entered bluntly. The bladder blade  was inserted and the lower uterine segment incised in a transverse fashion with the scalpel.  The uterine incision was then extended laterally with blunt dissection.  The amniotic fluid was noted to be clear. The surgeon's hand was placed into the uterus, fetal head was identified and elevated to the abdomen with the assistance of fundal pressure.  There was nuchal cord was identified x1. The rest of the body was delivered atraumatically. The cord was doubly clamped and cut. The infant was handed off to waiting pediatricians. A segment of cord and cord blood was obtained for further testing.    The placenta was removed manually with uterine massage. The uterus exteriorized and cleared of all clots and debris. The uterine incision was repaired with 0-vicryl in a running, locked fashion. A second layer of the same suture was used to imbricate  the first layer. The uterine tone was noted to be suboptimal despite infusion pitocin so methergine administer by anesthesia. Tone still decreased so IM hemabate was administered in the uterine muscle. The uterus then began to firm with proper tone. The  uterus was returned to the abdomen. The gutters were cleared of all  clots. The fascia was reapproximated with 0-vicryl in a running fashion. The subcutaneous tissue was approximated using plain gut in interrupted fashion. The skin was closed in a subcuticular  fashion with 4-0 monocryl with dermabond.    The patient tolerated the procedure well. Sponge, lap, needle and instrument counts were correct times two. The patient was taken to the recovery room in stable condition. Will plan to repeat dose of ancef in 6 hours due to inadequate prep with stat c-section.  Due to temperature of 100.2 on admission, will get COVID 19 testing. If fever develops or persist, will give 24h abx.       Murriel Hopper, MD

## 2018-07-10 NOTE — Progress Notes (Signed)
 Pt presents to [facility] with complaints of gushing of dark red blood from the vagina twice this morning upon waking. Pt denies any LOF that she has noticed other than that. She reports +FM although decreased since yesterday. No VD She denies sexual intercourse in the last 24 hours. Pt had a complete previa as of 5/19 and yesterday has a marginal previa per pt. Pt is rating her pain a 10/10 and states it's uncomfortable.

## 2018-07-10 NOTE — Anesthesia Post-Procedure Evaluation (Signed)
Procedure(s):  CESAREAN SECTION.    general    Anesthesia Post Evaluation      Multimodal analgesia: multimodal analgesia used between 6 hours prior to anesthesia start to PACU discharge  Patient location during evaluation: PACU  Patient participation: complete - patient participated  Level of consciousness: awake  Pain management: satisfactory to patient  Airway patency: patent  Anesthetic complications: no  Cardiovascular status: stable  Respiratory status: acceptable  Hydration status: stable  Post anesthesia nausea and vomiting:  controlled      INITIAL Post-op Vital signs:   Vitals Value Taken Time   BP 125/82 07/10/2018  8:16 AM   Temp 36.6 ??C (97.9 ??F) 07/10/2018  7:45 AM   Pulse 84 07/10/2018  8:24 AM   Resp 17 07/10/2018  8:24 AM   SpO2 100 % 07/10/2018  8:24 AM   Vitals shown include unvalidated device data.

## 2018-07-10 NOTE — Progress Notes (Signed)
CODE GOLD called

## 2018-07-10 NOTE — Progress Notes (Signed)
MD Butts at bedside. SBAR, FHT, ctx pattern relayed to MD. Pt temp 100.0 and 100.2 when checked the second time. Pt in 10/10 pain. Abd is soft but she is feeling intermittent cramping. Per MD, pt to go for csection.

## 2018-07-10 NOTE — Progress Notes (Signed)
Pt presents to [facility] with complaints of gushing of dark red blood from the vagina twice this morning upon waking. Pt denies any LOF that she has noticed other than that. She reports +FM although decreased since yesterday. No VD She denies sexual intercourse in the last 24 hours. Pt had a complete previa as of 5/19 and yesterday has a marginal previa per pt. Pt is rating her pain a 10/10 and states "it's uncomfortable."

## 2018-07-10 NOTE — H&P (Addendum)
Labor History & Physical    Name: Lauren Andrade MRN: [id number]  SSN: [id number]    Date of Birth: 03/02/1995  Age: 23 y.o.  Sex: female        Subjective:     Estimated Date of Delivery: 07/28/18  OB History     Gravida   2    Para   1    Term   1    Preterm        AB   1    Living   1       SAB   1    TAB        Ectopic        Molar        Multiple   0    Live Births   1                Lauren Andrade is admitted with pregnancy at [redacted]w[redacted]d for vaginal bleeding with marginal previa. Prenatal course was complicated by complete placenta previa. Prenatal care has been followed by [facility] since first trimester.  Please see prenatal records for details. Pt having abdominal pain 10/10 and notes big gush of blood at home which continues to flow. Pt denies any CP, SOB, cough, fevers, chills.     Past Medical History:   Diagnosis Date   ??? Placenta previa     complete previa   ??? UTI (urinary tract infection)      No past surgical history on file.  Social History     Occupational History   ??? Not on file   Tobacco Use   ??? Smoking status: Never Smoker   ??? Smokeless tobacco: Never Used   Substance and Sexual Activity   ??? Alcohol use: Not Currently     Frequency: Never   ??? Drug use: Never   ??? Sexual activity: Not on file     Family History   Problem Relation Age of Onset   ??? Hypertension Mother    ??? No Known Problems Father        Allergies   Allergen Reactions   ??? Sulfa (Sulfonamide Antibiotics) Rash     Prior to Admission medications    Medication Sig Start Date End Date Taking? Authorizing Provider   iron,carb/vit C/vit B12/folic (IRON 100 PLUS PO) Take  by mouth.   Yes Provider, Historical   PNV No12-Iron-FA-DSS-OM-3 29 mg iron-1 mg -50 mg CPKD Take  by mouth.   Yes Provider, Historical   cholecalciferol (VITAMIN D3) (50,000 UNITS /1250 MCG) capsule Take  by mouth every seven (7) days.    Provider, Historical      Review of Systems:   Constitutional: temp of 100.2  Eyes: negative  Respiratory: negative   Cardiovascular: negative  Gastrointestinal: negative  Genitourinary:negative  Hematologic/lymphatic: negative  Musculoskeletal:negative  Neurological: negative  Behavioral/Psych: negative    Objective:     Vitals:  Vitals:    07/10/18 0605 07/10/18 0614   BP: 131/84    Resp: 16    Temp: 100 ??F (37.8 ??C)    Weight:  78.9 kg (174 lb)   Height:  5\' 4"  (1.626 m)      Physical Exam:  Patient with distress.  HEENT-NC/AT  CV-Normal rate  Abd-gravid, soft, no rebound or guarding  GU-moderate vaginal bleeding, there is a moderate to large clot in the vault  Cx- 1 cm/thick, soft,  warm to touch  Membranes:  Intact  Fetal  Heart Rate: Reactive  FHT: 150s, minimal to moderate, occasional variable      MS-no calf tenderness  Neuro-A&O x 4  Prenatal Labs:   Lab Results   Component Value Date/Time    Rubella, External immune 12/29/2017    HBsAg, External neg 12/29/2017    HIV, External nr 12/29/2017    RPR, External nr 12/29/2017    Gonorrhea, External neg 12/29/2017    Chlamydia, External neg 12/29/2017     Group B Strep was unknown.     Assessment/Plan:     Plan: Admit for Proceed with Cesarean Section for nonreassuring fetal status in the setting of suspected abruption with moderate vaginal bleeding. Pt also with temperature or 100.2 per RN and pt warm to touch. Will proceed with cesarean section. Ancef given. Will get COVID 19 testing. Risks of bleeding, infection, bladder and bowel damage previously discussed with patient. C-section called as code GOLD today due to concern for fetal status.         Signed By:  Murriel Hopperiera C Demiana Crumbley, MD     July 10, 2018   7:33 AM

## 2018-07-10 NOTE — Anesthesia Pre-Procedure Evaluation (Signed)
Relevant Problems   No relevant active problems       Anesthetic History   No history of anesthetic complications            Review of Systems / Medical History  Patient summary reviewed, nursing notes reviewed and pertinent labs reviewed    Pulmonary  Within defined limits                 Neuro/Psych   Within defined limits           Cardiovascular  Within defined limits                Exercise tolerance: >4 METS     GI/Hepatic/Renal  Within defined limits              Endo/Other        Anemia     Other Findings              Physical Exam    Airway  Mallampati: II  TM Distance: 4 - 6 cm  Neck ROM: normal range of motion        Cardiovascular               Dental         Pulmonary                 Abdominal         Other Findings            Anesthetic Plan    ASA: 2, emergent  Anesthesia type: general                Pt presented with previa, code gold called.  Interview in OR.

## 2018-07-10 NOTE — Progress Notes (Addendum)
Late entry:    0625-Dr. Smits called. Anastasia Pall SA notified. OBT and Nsy notified of C/S called for complete previa/labor.    0630-Dr. Phillis Knack called back and now informed of Code Gold and that Dr. Claudia Pollock will scrub in with Dr. Maylon Cos.     0631-This RN to bs; PIV started by this RN after initial IV infiltrated; 2nd PIV started by C. Elvir.    0633-Pt transferred to OR1 via bed accompanied by S. Zenia Resides, RN, L. Sobczak RN, and this RN and met in room by Dr. Lucilla Edin,. Dr. Maylon Cos, and Dr. Phillis Knack and Dr. Argentina Ponder.

## 2018-07-10 NOTE — Progress Notes (Signed)
MD Butts at bedside. SBAR, FHT, ctx pattern relayed to MD. Pt temp 100.0 and 100.2 when checked the second time. Pt in 10/10 pain. Abd is soft but she is feeling intermittent cramping. Per MD, pt to go for csection.

## 2018-07-10 NOTE — Other (Signed)
TRANSFER - IN REPORT:    Verbal report received from Jenn S RN(name) on Lauren Andrade  being received from L&D(unit) for routine progression of care      Report consisted of patient???s Situation, Background, Assessment and   Recommendations(SBAR).     Information from the following report(s) SBAR, Kardex, Procedure Summary, Intake/Output, MAR and Recent Results was reviewed with the receiving nurse.    Opportunity for questions and clarification was provided.      Assessment completed upon patient???s arrival to unit and care assumed.     PCA was set up and verified

## 2018-07-10 NOTE — Progress Notes (Deleted)
MD Butts at bedside

## 2018-07-10 NOTE — Lactation Note (Signed)
This note was copied from a baby's chart.  In to see mother of infant for initial lactation consult to discuss breastfeeding goals and expectations. Verbal consent received to discuss hx and breastfeeding care with friend on facetime.  Mother's significant health hx includes placenta previa.  She reports no breastfeeding experience/attending breastfeeding classes.  Reviewed breastfeeding handout with mother.  Discussed lactogenesis, newborn stomach size, expected newborn feeding patterns the first 24 hrs of life, hunger cues, skin-to-skin, cluster feeding (and its benefits), hand expression, and daily feeding goals.   Breast assessment deferred, MOB on facetime with friend. Infant not in room at this time. Opportunity for questions given and all questions answered. Mother states she will call for further assistance when needed.      1840- In room to f/u on lactation care, infant at bedside, sleeping. MOB reports he is latching on one side better than the other but has been latching on and off. Discussed normal newborn feeding patterns/expectations. She verbalizes an understanding of all information.

## 2018-07-10 NOTE — Progress Notes (Addendum)
TC to Dr. Girmes to report pt OOB voiding, would like to discontinue PCA Pump and start PO pain medications.  New orders received to start Ibuprofen 600mg PO every 6 hours as needed, percocet one tab PO every four hours as needed or percocet two tabs PO every six hours as needed for severe pain.

## 2018-07-10 NOTE — Anesthesia Post-Procedure Evaluation (Signed)
Procedure(s):  CESAREAN SECTION.    general    Anesthesia Post Evaluation      Multimodal analgesia: multimodal analgesia used between 6 hours prior to anesthesia start to PACU discharge  Patient location during evaluation: PACU  Patient participation: complete - patient participated  Level of consciousness: awake  Pain management: satisfactory to patient  Airway patency: patent  Anesthetic complications: no  Cardiovascular status: stable  Respiratory status: acceptable  Hydration status: stable  Post anesthesia nausea and vomiting:  controlled      INITIAL Post-op Vital signs:   Vitals Value Taken Time   BP 125/82 07/10/2018  8:16 AM   Temp 36.6 ??C (97.9 ??F) 07/10/2018  7:45 AM   Pulse 84 07/10/2018  8:24 AM   Resp 17 07/10/2018  8:24 AM   SpO2 100 % 07/10/2018  8:24 AM   Vitals shown include unvalidated device data.

## 2018-07-10 NOTE — Other (Signed)
Received ETCO2 monitor

## 2018-07-10 NOTE — Op Note (Signed)
Brief Post-Op Note    Patient: Lauren Andrade MRN: [id number]  SSN: [id number]    Date of Birth: Aug 20, 1995  Age: 23 y.o.  Sex: female      MRN: [id number]    Surgeon(s): Murriel Hopper, MD    Assistant(s):  Althea Grimmer, MD; Adonis Huguenin, SA    Pre-operative Diagnosis: Non-reassuring fetal heart tracing, Placental Abruption, Term pregnancy, Marginal placenta previa    Post-operative Diagnosis: Same as pre-operative diagnosis    Procedure(s) Performed: Procedure(s):  Primary Low Transverse cesarean section.     Anesthesia: general anesthesia    Findings: Vaginal bleeding, large clot in vaginal vault and cervix dilated 1 cm. Gravid uterus with single viable female infant weighing 2890g, APGARs 8/9 delivered through clear amniotic fluid. Normal appearing uterus, bilateral fallopian tubes and ovaries.      Complications: None    Estimated Blood Loss:  700 ml    Tubes and Drains: Foley catheter with clear yellow urine.     Specimens:   ID Type Source Tests Collected by Time Destination   1 : Placenta IUP @ 37.3 wk Organ Placenta AP HISTOLOGY Sunny Aguon, Elberta Fortis, MD 07/10/2018 0700      Indications for the procedure: Lauren Andrade is a  23 y/o now G2P1011 at 91.3 who presented for vaginal bleeding. Pt had a placenta previa which was resolving, noted to be 2 cm from os yesterday and opted for trial of labor. This morning, patient presented after large gush of bleeding, found to have clot of ~30 cc cc in the vagina, cervix thick and 1 cm and FHT non-reassuring with period of minimal variability and tachycardia. Decision was made to proceed with stat cesarean section.   Risks and benefits of the procedure were discussed with the patient and verbal consent obtained.   The patient was then taken to the operating room. Time out was performed to confirm correct patient and correct procedure. She was splashed with betadine and draped in a sterile fashion  in the dorsal supine position  with a leftward tilt of the hips.. General anesthesia was found to be adequate. A pfannenstiel skin incision was made with a scalpel and carried through to the underlying layer of fascia. The fascia was incised in the midline and the incision extended bluntly. There was blunt dissection of fascia from the rectus abdominis muscles. The rectus muscles were separated in the midline, and the peritoneum identified, and entered bluntly. The bladder blade was inserted and the lower uterine segment incised in a transverse fashion with the scalpel. The uterine incision was then extended laterally with blunt dissection.  The amniotic fluid was noted to be clear. The surgeon's hand was placed into the uterus, fetal head was identified and elevated to the abdomen with the assistance of fundal pressure. There was nuchal cord was identified x1. The rest of the body was delivered atraumatically. The cord was doubly clamped and cut. The infant was handed off to waiting pediatricians. A segment of cord and cord blood was obtained for further testing.   The placenta was removed manually with uterine massage. The uterus exteriorized and cleared of all clots and debris. The uterine incision was repaired with 0-vicryl in a running, locked fashion. A second layer of the same suture was used to imbricate the first layer. The uterine tone was noted to be suboptimal despite infusion pitocin so methergine administer by anesthesia. Tone still decreased so IM hemabate was administered in the uterine muscle. The uterus then began to firm with  proper tone. The uterus was returned to the abdomen. The gutters were cleared of all clots. The fascia was reapproximated with 0-vicryl in a running fashion. The subcutaneous tissue was approximated using plain gut in interrupted fashion. The skin was closed in a subcuticular fashion with 4-0 monocryl with dermabond.   The patient tolerated the procedure well. Sponge, lap, needle and  instrument counts were correct times two. The patient was taken to the recovery room in stable condition. Will plan to repeat dose of ancef in 6 hours due to inadequate prep with stat c-section. Due to temperature of 100.2 on admission, will get COVID 19 testing. If fever develops or persist, will give 24h abx.     Murriel Hopperiera C Roderick Calo, MD

## 2018-07-10 NOTE — Other (Signed)
Bedside and Verbal shift change report given to Lesly Dukes, RN (oncoming nurse) by France Ravens, RN (offgoing nurse). Report included the following information SBAR, Procedure Summary, Intake/Output, MAR and Recent Results.

## 2018-07-10 NOTE — Progress Notes (Signed)
CODE GOLD called

## 2018-07-10 NOTE — Progress Notes (Signed)
TC to Dr. Lu Duffel to report pt OOB voiding, would like to discontinue PCA Pump and start PO pain medications.  New orders received to start Ibuprofen 600mg  PO every 6 hours as needed, percocet one tab PO every four hours as needed or percocet two tabs PO every six hours as needed for severe pain.

## 2018-07-10 NOTE — Progress Notes (Signed)
Late entry:    0625-Dr. Smits called. Anastasia Pall SA notified. OBT and Nsy notified of C/S called for complete previa/labor.    0630-Dr. Phillis Knack called back and now informed of Code Gold and that Dr. Claudia Pollock will scrub in with Dr. Maylon Cos.     0631-This RN to bs; PIV started by this RN after initial IV infiltrated; 2nd PIV started by C. Elvir.    0633-Pt transferred to OR1 via bed accompanied by S. Zenia Resides, RN, L. Sobczak RN, and this RN and met in room by Dr. Lucilla Edin,. Dr. Maylon Cos, and Dr. Phillis Knack and Dr. Argentina Ponder.

## 2018-07-10 NOTE — Consults (Signed)
This note was copied from a baby's chart.  In to see mother of infant for initial lactation consult to discuss breastfeeding goals and expectations. Verbal consent received to discuss hx and breastfeeding care with friend on facetime.  Mother's significant health hx includes placenta previa.  She reports no breastfeeding experience/attending breastfeeding classes.  Reviewed breastfeeding handout with mother.  Discussed lactogenesis, newborn stomach size, expected newborn feeding patterns the first 24 hrs of life, hunger cues, skin-to-skin, cluster feeding (and its benefits), hand expression, and daily feeding goals.   Breast assessment deferred, MOB on facetime with friend. Infant not in room at this time. Opportunity for questions given and all questions answered. Mother states she will call for further assistance when needed.      1840- In room to f/u on lactation care, infant at bedside, sleeping. MOB reports he is latching on one side better than the other but has been latching on and off. Discussed normal newborn feeding patterns/expectations. She verbalizes an understanding of all information.

## 2018-07-10 NOTE — H&P (Signed)
H&P by Murriel HopperButts, Alaina Donati C,  MD at 07/10/18 [phone]                Author: Murriel HopperButts, Jaidyn Kuhl C, MD  Service: Obstetrics & Gynecology  Author Type: Physician       Filed: 07/10/18 0802  Date of Service: 07/10/18 0733  Status: Addendum          Editor: Murriel HopperButts, Breezie Micucci C, MD (Physician)          Related Notes: Original Note by Murriel HopperButts, Burma Ketcher C, MD (Physician) filed at 07/10/18 0747                       Labor History & Physical          Name: Lauren Andrade  MRN: [id number]   SSN: [id number]          Date of Birth: 1995-08-20   Age: 23 y.o.   Sex: female              Subjective:        Estimated Date of Delivery: 07/28/18     OB History                Gravida      2                Para      1                Term      1                Preterm                       AB      1                Living      1                             SAB      1                TAB                       Ectopic                       Molar                       Multiple      0                Live Births      1                                    [name] is admitted with pregnancy  at 4046w3d for vaginal bleeding with marginal previa. Prenatal course was complicated by  complete placenta previa. Prenatal care has been followed by [facility] since first trimester.  Please see prenatal records for details. Pt having abdominal pain 10/10 and notes big  gush of blood at home which continues to flow. Pt denies any CP, SOB, cough, fevers, chills.         Past Medical History:        Diagnosis  Date         ?  Placenta previa            complete previa         ?  UTI (urinary tract infection)          No past surgical history on file.     Social History          Occupational History        ?  Not on file       Tobacco Use         ?  Smoking status:  Never Smoker     ?  Smokeless tobacco:  Never Used       Substance and Sexual Activity         ?  Alcohol use:  Not Currently              Frequency:  Never         ?  Drug use:  Never         ?  Sexual  activity:  Not on file          Family History         Problem  Relation  Age of Onset          ?  Hypertension  Mother            ?  No Known Problems  Father               Allergies        Allergen  Reactions         ?  Sulfa (Sulfonamide Antibiotics)  Rash          Prior to Admission medications             Medication  Sig  Start Date  End Date  Taking?  Authorizing Provider            iron,carb/vit C/vit B12/folic (IRON 100 PLUS PO)  Take  by mouth.      Yes  Provider, Historical     PNV No12-Iron-FA-DSS-OM-3 29 mg iron-1 mg -50 mg CPKD  Take  by mouth.      Yes  Provider, Historical            cholecalciferol (VITAMIN D3) (50,000 UNITS /1250 MCG) capsule  Take  by mouth every seven (7) days.        Provider, Historical         Review of Systems:    Constitutional: temp of 100.2   Eyes: negative   Respiratory: negative   Cardiovascular: negative   Gastrointestinal: negative   Genitourinary:negative   Hematologic/lymphatic: negative   Musculoskeletal:negative   Neurological: negative   Behavioral/Psych: negative        Objective:        Vitals:     Vitals:           07/10/18 0605  07/10/18 0614         BP:  131/84       Resp:  16       Temp:  100 ??F (37.8 ??C)       Weight:    78.9 kg (174 lb)         Height:     (1.626 m)         Physical Exam:   Patient with distress.   HEENT-NC/AT   CV-Normal rate   Abd-gravid, soft, no  rebound or guarding   GU-moderate vaginal bleeding, there is a moderate to large clot in the vault   Cx- 1 cm/thick, soft,  warm to touch   Membranes:  Intact   Fetal Heart Rate: Reactive   FHT: 150s, minimal to moderate, occasional variable       MS-no calf tenderness   Neuro-A&O x 4   Prenatal Labs:      Lab Results         Component  Value  Date/Time            Rubella, External  immune  12/29/2017       HBsAg, External  neg  12/29/2017       HIV, External  nr  12/29/2017       RPR, External  nr  12/29/2017       Gonorrhea, External  neg  12/29/2017            Chlamydia, External  neg   12/29/2017        Group B Strep was unknown.         Assessment/Plan:        Plan: Admit for Proceed with Cesarean Section for nonreassuring fetal status in the setting of suspected abruption with moderate vaginal bleeding.  Pt also with temperature or 100.2 per RN and pt warm to touch. Will proceed with cesarean section. Ancef given. Will get COVID 19 testing. Risks of bleeding, infection, bladder and bowel damage previously discussed with patient. C-section called as code  GOLD today due to concern for fetal status.               Signed By:   Murriel Hopper, MD        July 10, 2018    7:33 AM

## 2018-07-10 NOTE — Anesthesia Pre-Procedure Evaluation (Signed)
Relevant Problems   No relevant active problems       Anesthetic History   No history of anesthetic complications            Review of Systems / Medical History  Patient summary reviewed, nursing notes reviewed and pertinent labs reviewed    Pulmonary  Within defined limits                 Neuro/Psych   Within defined limits           Cardiovascular  Within defined limits                Exercise tolerance: >4 METS     GI/Hepatic/Renal  Within defined limits              Endo/Other        Anemia     Other Findings              Physical Exam    Airway  Mallampati: II  TM Distance: 4 - 6 cm  Neck ROM: normal range of motion        Cardiovascular               Dental         Pulmonary                 Abdominal         Other Findings            Anesthetic Plan    ASA: 2, emergent  Anesthesia type: general                Pt presented with previa, code gold called.  Interview in OR.

## 2018-07-11 MED ORDER — OXYCODONE-ACETAMINOPHEN 5 MG-325 MG TAB
5-325 mg | ORAL | Status: DC | PRN
Start: 2018-07-11 — End: 2018-07-12
  Administered 2018-07-11 – 2018-07-12 (×3): via ORAL

## 2018-07-11 MED ORDER — FERROUS SULFATE 325 MG (65 MG ELEMENTAL IRON) TAB
325 mg (65 mg iron) | Freq: Every day | ORAL | Status: DC
Start: 2018-07-11 — End: 2018-07-12
  Administered 2018-07-11 – 2018-07-12 (×2): via ORAL

## 2018-07-11 MED ORDER — OXYCODONE-ACETAMINOPHEN 5 MG-325 MG TAB
5-325 mg | Freq: Four times a day (QID) | ORAL | Status: DC | PRN
Start: 2018-07-11 — End: 2018-07-12
  Administered 2018-07-11: 15:00:00 via ORAL

## 2018-07-11 MED FILL — OXYCODONE-ACETAMINOPHEN 5 MG-325 MG TAB: 5-325 mg | ORAL | Qty: 1

## 2018-07-11 MED FILL — PRENATAL-U 106.5 MG-1 MG CAPSULE: ORAL | Qty: 1

## 2018-07-11 MED FILL — IBUPROFEN 600 MG TAB: 600 mg | ORAL | Qty: 1

## 2018-07-11 MED FILL — FERROUS SULFATE 325 MG (65 MG ELEMENTAL IRON) TAB: 325 mg (65 mg iron) | ORAL | Qty: 1

## 2018-07-11 NOTE — Progress Notes (Signed)
Progress  Notes by Murriel Hopper, MD at 07/11/18 [phone]                Author: Murriel Hopper, MD  Service: Obstetrics & Gynecology  Author Type: Physician       Filed: 07/11/18 0802  Date of Service: 07/11/18 0758  Status: Signed          Editor: Murriel Hopper, MD (Physician)                                CS Rounding Note          Patient: Lauren Andrade  MRN: [id number]   SSN: [id number]          Date of Birth: February 08, 1995   Age: 23 y.o.   Baby:    Information for the patient's newborn:   Jayann, Vaquero [[id number]]          female           POD: 1 Day Post-Op       Delivery Date/Time:        Information for the patient's newborn:   Imonie, Spell [[id number]]     07/10/2018          Feeding Method: Infant Feeding: Breast Milk      Diagnosis: Placental abruption in third trimester [O45.93]     Patient Active Problem List        Diagnosis  Code         ?  Placental abruption in third trimester  O45.93           This morning, Ms. Lamons is doing well. She has been transitioned to PO pain medication and is doing well with pain control. She is feeling well this morning. She notes she has been ambulating, voiding  and eating well, no nausea or vomiting. On examination, patient is in good general condition in no apparent distress.        Visit Vitals      BP  114/64 (BP 1 Location: Right arm, BP Patient Position: Sitting)     Pulse  77     Temp  98 ??F (36.7 ??C)     Resp  18     Ht  5\' 4"  (1.626 m)     Wt  78.9 kg (174 lb)     SpO2  100%     Breastfeeding  Unknown        BMI  29.87 kg/m??          Temp (24hrs), Avg:98.5 ??F (36.9 ??C), Min:98 ??F (36.7 ??C), Max:99.3 ??F (37.4 ??C)         Prenatal Labs:     Lab Results         Component  Value  Date/Time            Rubella, External  immune  12/29/2017              Recent Labs             07/10/18   1226  07/10/18   0640  07/09/18   0726     HGB  10.9*  11.4*  11.2*          WBC  19.1*  17.1*  13.9*            Physical Exam:    Lungs clear to auscultation.   Cardiac:  Normal rate,  regular rhythm   Abdomen soft not tender, ,fundus at U-2   Bowel sounds are normal   Incisions are clean/dry/intact with dermabond in place   Legs are normal without tenderness, swelling, or redness.       Impression: 22 G2P1011 s/p cesarean section for marginal placenta previa with bleeding, doing well on POD1.    Plan: Continue to monitor with routine post-op and postpartum care. Remains afebrile overnight. Discharge when appropriate in next day or two.         Murriel Hopperiera C Ivory Bail, MD   07/11/2018   7:58 AM

## 2018-07-11 NOTE — Consults (Signed)
This note was copied from a baby's chart.  Newborn continues to room in with mom.  Mom states that she is supplementing with formula.  Stressed the importance of offering breast prior to formula.  Encouraged to request latch assistance with baby's next feeding.

## 2018-07-11 NOTE — Lactation Note (Signed)
This note was copied from a baby's chart.  Newborn continues to room in with mom.  Mom states that she is supplementing with formula.  Stressed the importance of offering breast prior to formula.  Encouraged to request latch assistance with baby's next feeding.

## 2018-07-11 NOTE — Other (Signed)
Rounding completed on mom and infant for shift 1900-0700.  Mom and infant stable.

## 2018-07-11 NOTE — Other (Signed)
Rounds completed, mom & baby stable.

## 2018-07-11 NOTE — Other (Signed)
Bedside and Verbal shift change report given to K.Lock,RN (oncoming nurse) by L.Bidanset,RN (offgoing nurse). Report included the following information Kardex, OR Summary, Intake/Output, MAR and Recent Results.

## 2018-07-11 NOTE — Progress Notes (Signed)
CS Rounding Note    Patient: Lauren Andrade MRN: [id number]  SSN: [id number]    Date of Birth: 1995-08-01  Age: 23 y.o.  Baby:   Information for the patient's newborn:  Lauren, Andrade [[id number]]   female       POD: 1 Day Post-Op     Delivery Date/Time:    Information for the patient's newborn:  Lauren, Andrade [[id number]]   07/10/2018       Feeding Method: Infant Feeding: Breast Milk    Diagnosis: Placental abruption in third trimester [O45.93]  Patient Active Problem List   Diagnosis Code   ??? Placental abruption in third trimester O45.93       This morning, [name] is doing well. She has been transitioned to PO pain medication and is doing well with pain control. She is feeling well this morning. She notes she has been ambulating, voiding and eating well, no nausea or vomiting. On examination, patient is in good general condition in no apparent distress.      Visit Vitals  BP 114/64 (BP 1 Location: Right arm, BP Patient Position: Sitting)   Pulse 77   Temp 98 ??F (36.7 ??C)   Resp 18   Ht 5\' 4"  (1.626 m)   Wt 78.9 kg (174 lb)   SpO2 100%   Breastfeeding Unknown   BMI 29.87 kg/m??       Temp (24hrs), Avg:98.5 ??F (36.9 ??C), Min:98 ??F (36.7 ??C), Max:99.3 ??F (37.4 ??C)      Prenatal Labs:  Lab Results   Component Value Date/Time    Rubella, External immune 12/29/2017        Recent Labs     07/10/18  1226 07/10/18  0640 07/09/18  0726   HGB 10.9* 11.4* 11.2*   WBC 19.1* 17.1* 13.9*        Physical Exam:   Lungs clear to auscultation.  Cardiac: Normal rate, regular rhythm  Abdomen soft not tender, ,fundus at U-2  Bowel sounds are normal  Incisions are clean/dry/intact with dermabond in place  Legs are normal without tenderness, swelling, or redness.     Impression: 22 G2P1011 s/p cesarean section for marginal placenta previa with bleeding, doing well on POD1.   Plan: Continue to monitor with routine post-op and postpartum care. Remains afebrile overnight. Discharge when appropriate in next day or two.       Murriel Hopper, MD  07/11/2018  7:58 AM

## 2018-07-11 NOTE — Other (Signed)
Chart check completed.

## 2018-07-11 NOTE — Progress Notes (Signed)
Problem: Cesarean Delivery: Postpartum Day 1  Goal: Off Pathway (Use only if patient is Off Pathway)  Outcome: Progressing Towards Goal  Goal: Activity/Safety  Outcome: Progressing Towards Goal  Goal: Consults, if ordered  Outcome: Progressing Towards Goal  Goal: Diagnostic Test/Procedures  Outcome: Progressing Towards Goal  Goal: Nutrition/Diet  Outcome: Progressing Towards Goal  Goal: Discharge Planning  Outcome: Progressing Towards Goal  Goal: Medications  Outcome: Progressing Towards Goal  Goal: Respiratory  Outcome: Progressing Towards Goal  Goal: Treatments/Interventions/Procedures  Outcome: Progressing Towards Goal  Goal: Psychosocial  Outcome: Progressing Towards Goal  Goal: *Vital signs within defined limits  Outcome: Progressing Towards Goal  Goal: *Labs within defined limits  Outcome: Progressing Towards Goal  Goal: *Hemodynamically stable  Outcome: Progressing Towards Goal  Goal: *Optimal pain control at patient's stated goal  Outcome: Progressing Towards Goal  Goal: *Participates in infant care  Outcome: Progressing Towards Goal  Goal: *Demonstrates progressive activity  Outcome: Progressing Towards Goal  Goal: *Tolerating diet  Outcome: Progressing Towards Goal  Goal: *Performs self perineal care  Outcome: Progressing Towards Goal

## 2018-07-11 NOTE — Other (Signed)
Bedside, Verbal and Written shift change report given to Tamieca Holloman (oncoming nurse) by Kathy Lock (offgoing nurse). Report included the following information SBAR, Kardex, ED Summary, OR Summary, Procedure Summary, Intake/Output, MAR, Accordion, Recent Results and Med Rec Status.

## 2018-07-12 MED ORDER — OXYCODONE-ACETAMINOPHEN 5 MG-325 MG TAB
5-325 mg | ORAL_TABLET | Freq: Four times a day (QID) | ORAL | 0 refills | Status: DC | PRN
Start: 2018-07-12 — End: 2018-07-12

## 2018-07-12 MED ORDER — OXYCODONE-ACETAMINOPHEN 5 MG-325 MG TAB
5-325 mg | ORAL_TABLET | Freq: Four times a day (QID) | ORAL | 0 refills | Status: AC | PRN
Start: 2018-07-12 — End: 2018-07-17

## 2018-07-12 MED ORDER — IBUPROFEN 600 MG TAB
600 mg | ORAL_TABLET | Freq: Four times a day (QID) | ORAL | 0 refills | Status: AC | PRN
Start: 2018-07-12 — End: ?

## 2018-07-12 MED FILL — FERROUS SULFATE 325 MG (65 MG ELEMENTAL IRON) TAB: 325 mg (65 mg iron) | ORAL | Qty: 1

## 2018-07-12 MED FILL — OXYCODONE-ACETAMINOPHEN 5 MG-325 MG TAB: 5-325 mg | ORAL | Qty: 1

## 2018-07-12 MED FILL — IBUPROFEN 600 MG TAB: 600 mg | ORAL | Qty: 1

## 2018-07-12 MED FILL — PRENATAL-U 106.5 MG-1 MG CAPSULE: ORAL | Qty: 1

## 2018-07-12 MED FILL — SENNA LAX 8.6 MG TABLET: 8.6 mg | ORAL | Qty: 2

## 2018-07-12 NOTE — Progress Notes (Signed)
Progress  Notes by Murriel HopperButts, Dollene Mallery C, MD at 07/12/18 [phone]                Author: Murriel HopperButts, Desirey Keahey C, MD  Service: Obstetrics & Gynecology  Author Type: Physician       Filed: 07/12/18 0839  Date of Service: 07/12/18 0747  Status: Signed          Editor: Murriel HopperButts, Daryana Whirley C, MD (Physician)                                CS Rounding Note          Patient: Lauren Andrade  MRN: [id number]   SSN: [id number]          Date of Birth: [birth year]   Age: 23 y.o.   Baby:    Information for the patient's newborn:   Lauren Andrade, BOY  Lauren Andrade [[id number]][[id number]]          female           POD: 1 Day Post-Op       Delivery Date/Time:        Information for the patient's newborn:   Lauren Andrade, BOY  Lauren Andrade [[id number]][[id number]]     07/10/2018          Feeding Method: Infant Feeding: Breast Milk      Diagnosis: Placental abruption in third trimester [O45.93]     Patient Active Problem List        Diagnosis  Code         ?  Placental abruption in third trimester  O45.93           This morning, Ms. Lauren Andrade is doing very well. She denies any issues or complaints overnight. Continues with ambulation without difficulty, voiding and eating well, no nausea or vomiting. Denies any fevers,  chills, CP, SOB. On examination, patient is in good general condition in no apparent distress.        Visit Vitals      BP  107/68 (BP 1 Location: Right arm, BP Patient Position: Supine)     Pulse  82     Temp  98.5 ??F (36.9 ??C)     Resp  16     Ht  5\' 4"  (1.626 m)     Wt  78.9 kg (174 lb)     SpO2  99%     Breastfeeding  Unknown        BMI  29.87 kg/m??          Temp (24hrs), Avg:98.2 ??F (36.8 ??C), Min:97.9 ??F (36.6 ??C), Max:98.5 ??F (36.9 ??C)         Prenatal Labs:     Lab Results         Component  Value  Date/Time            Rubella, External  immune  12/29/2017              Recent Labs            07/10/18   1226  07/10/18   0640     HGB  10.9*  11.4*         WBC  19.1*  17.1*            Physical Exam:    Normal heart rate   Normal respiratory effort   Abdomen soft non tender, fundus at U-2   Bowel sounds are  normal  Incisions are clean/dry/intact with dermabond in place   Legs are normal without tenderness or redness. Trace edema bilaterally.       Impression: 22 G2P1011 s/p cesarean section for marginal placenta previa with bleeding, doing well on POD1.    Plan: Continue to monitor with routine post-op and postpartum care. Remains afebrile overnight. Discharge today. Pt to follow-up in office  in 2 weeks. Discharge instruction reviewed.         Murriel Hopper, MD   07/12/2018   7:58 AM

## 2018-07-12 NOTE — Progress Notes (Signed)
Chart check completed

## 2018-07-12 NOTE — Consults (Signed)
 This note was copied from a baby's chart.  S: Discharge visit    B:  weight loss 5.3%    A: Mother reports breastfeeding going poorly, states she has been bottle feeding because baby won't latch  Reviewed discharge instructions  Discussed difference between rooting and sucking reflex at the breast and sucking reflex from palate stimulation with bottle.  Discussed ways to help with latch, positions, waking baby, skin to skin, unswaddling baby  Instructed mom to call for latch assistance with next feed if she was interested in latching baby.    OP lactation number to call with breastfeeding concerns    R: Continue breastfeeding upon request at least 8-12 times per 24hr period  Frequent skin to skin  Continue feeding record to monitor intake and output, if not making minimum diaper goals, notify pediatrician  Call OP lactation center at [phone] with bf related concerns

## 2018-07-12 NOTE — Lactation Note (Signed)
This note was copied from a baby's chart.  S: Discharge visit    B:  weight loss 5.3%    A: Mother reports breastfeeding going poorly, states she has been bottle feeding because "baby won't latch"  Reviewed discharge instructions  Discussed difference between rooting and sucking reflex at the breast and sucking reflex from palate stimulation with bottle.  Discussed ways to help with latch, positions, waking baby, skin to skin, unswaddling baby  Instructed mom to call for latch assistance with next feed if she was interested in latching baby.    OP lactation number to call with breastfeeding concerns    R: Continue breastfeeding upon request at least 8-12 times per 24hr period  Frequent skin to skin  Continue feeding record to monitor intake and output, if not making minimum diaper goals, notify pediatrician  Call OP lactation center at [phone] with bf related concerns

## 2018-07-12 NOTE — Other (Signed)
Bedside and Verbal shift change report given to Linda Lee Shea, RN (oncoming nurse) by Tamieca Holloman, RN (offgoing nurse). Report included the following information SBAR, MAR and Recent Results.

## 2018-07-12 NOTE — Other (Addendum)
19:10 report given, infant in crib, mom in bed watching tv, dad at the bedside  19:45 assessment completed, started discharge video, infant taken to nursery for screening  20:23 pt in bed, infant in nursery, pt rate pain 7/10 motrin and percocet given   21:15 pt in bed, infant in crib, pt watching tv, pt rate pain 2/10  23:53 pt in bed holding infant and watching tv  01:37 pt in bed watching tv, infant in crib, obtain vitals on infant  03:15 pt in bed resting, infant in crib,   03:50 pt in bed holding infant and watching tv, pt rate abdomianl pain 7/10, motrin given, assessed Fundal  05:52 pt in bed resting, infant in crib        All Hourly rounding completed on patient and Infant, Pt's are stable at this time.

## 2018-07-12 NOTE — Other (Signed)
Pt discharged in stable condition via wheelchair with infant in car seat in her lap with infant's father at her side.

## 2018-07-12 NOTE — Progress Notes (Signed)
CS Rounding Note    Patient: Lauren Andrade MRN: [id number]  SSN: [id number]    Date of Birth: [birth year]  Age: 23 y.o.  Baby:   Information for the patient's newborn:  Leyla, Peoples [[id number]]   female       POD: 1 Day Post-Op     Delivery Date/Time:    Information for the patient's newborn:  Esti, Vuncannon [[id number]]   07/10/2018       Feeding Method: Infant Feeding: Breast Milk    Diagnosis: Placental abruption in third trimester [O45.93]  Patient Active Problem List   Diagnosis Code   ??? Placental abruption in third trimester O45.93       This morning, Ms. Colao is doing very well. She denies any issues or complaints overnight. Continues with ambulation without difficulty, voiding and eating well, no nausea or vomiting. Denies any fevers, chills, CP, SOB. On examination, patient is in good general condition in no apparent distress.      Visit Vitals  BP 107/68 (BP 1 Location: Right arm, BP Patient Position: Supine)   Pulse 82   Temp 98.5 ??F (36.9 ??C)   Resp 16   Ht 5\' 4"  (1.626 m)   Wt 78.9 kg (174 lb)   SpO2 99%   Breastfeeding Unknown   BMI 29.87 kg/m??       Temp (24hrs), Avg:98.2 ??F (36.8 ??C), Min:97.9 ??F (36.6 ??C), Max:98.5 ??F (36.9 ??C)      Prenatal Labs:  Lab Results   Component Value Date/Time    Rubella, External immune 12/29/2017        Recent Labs     07/10/18  1226 07/10/18  0640   HGB 10.9* 11.4*   WBC 19.1* 17.1*        Physical Exam:   Normal heart rate  Normal respiratory effort  Abdomen soft non tender, fundus at U-2  Bowel sounds are normal  Incisions are clean/dry/intact with dermabond in place  Legs are normal without tenderness or redness. Trace edema bilaterally.     Impression: 23 G2P1011 s/p cesarean section for marginal placenta previa with bleeding, doing well on POD1.   Plan: Continue to monitor with routine post-op and postpartum care. Remains afebrile overnight. Discharge today. Pt to follow-up in office in 2 weeks. Discharge instruction reviewed.       Murriel Hopper, MD  07/12/2018   7:58 AM

## 2018-07-12 NOTE — Other (Addendum)
----------  DocumentID: [id number]------------------------------------------------              [facility]                       Patient Education Report         Name: Lauren Andrade, Lauren Andrade                  Date: 07/10/2018    MRN: [id number]                    Time: 9:25:20 AM         Patient ordered video: 'Safe Sleep Practices'    from [id number] via phone number: [phone] at 9:25:20 AM    Description: Safe Sleep Practices    ----------DocumentID: [id number]------------------------------------------------              [facility]                       Patient Education Report         Name: Lauren Andrade, Lauren Andrade                  Date: 07/11/2018    MRN: [id number]                    Time: 7:34:09 PM         Patient ordered video: 'Understanding Mother Baby Care'    from [id number]_[id number]_[id number] via phone number: [phone] at 7:34:09 PM    Description: This program helps you clearly understand the most current need-to-know discharge information?????including postpartum recovery tips, breastfeeding basics, newborn care, and when to call healthcare providers.    ----------DocumentID: [id number]------------------------------------------------                       [facility]          Patient Education Report - Discharge Summary        Date: 07/12/2018   Time: 2:44:58 PM   Name: Lauren Andrade, Lauren Andrade   MRN: [id number]      Account Number: [id number]      Education History:        Patient ordered video: 'Safe Sleep Practices' from [id number] on 07/10/2018 09:25:20 AM    Patient ordered video: 'Understanding Mother Baby Care' from [id number] on 07/11/2018 07:34:09 PM

## 2019-04-22 ENCOUNTER — Other Ambulatory Visit: Payer: Self-pay

## 2019-04-22 ENCOUNTER — Encounter (HOSPITAL_COMMUNITY): Payer: Self-pay | Admitting: Emergency Medicine

## 2019-04-22 ENCOUNTER — Emergency Department (HOSPITAL_COMMUNITY)
Admission: EM | Admit: 2019-04-22 | Discharge: 2019-04-22 | Disposition: A | Discharge status: Home / Self Care | Payer: Medicaid Other | Attending: Emergency Medicine | Admitting: Emergency Medicine

## 2019-04-22 ENCOUNTER — Emergency Department (HOSPITAL_COMMUNITY): Payer: Medicaid Other

## 2019-04-22 DIAGNOSIS — R079 Chest pain, unspecified: Secondary | ICD-10-CM | POA: Diagnosis present

## 2019-04-22 DIAGNOSIS — R05 Cough: Secondary | ICD-10-CM | POA: Diagnosis not present

## 2019-04-22 DIAGNOSIS — R072 Precordial pain: Secondary | ICD-10-CM | POA: Diagnosis not present

## 2019-04-22 LAB — TROPONIN I (HIGH SENSITIVITY): Troponin I (High Sensitivity): <2 ng/L (ref <18)

## 2019-04-22 MED ORDER — ACETAMINOPHEN 500 MG PO TABS
1000.0000 mg | ORAL_TABLET | Freq: Once | ORAL | Status: AC
  Administered 2019-04-22: 1000 mg via ORAL
  Filled 2019-04-22: qty 2

## 2019-04-22 MED ORDER — ALUM & MAG HYDROXIDE-SIMETH 200-200-20 MG/5ML PO SUSP
30.0000 mL | Freq: Once | ORAL | Status: AC
  Administered 2019-04-22: 30 mL via ORAL
  Filled 2019-04-22: qty 30

## 2019-04-22 MED ORDER — FAMOTIDINE 20 MG PO TABS
20.0000 mg | ORAL_TABLET | Freq: Once | ORAL | Status: AC
  Administered 2019-04-22: 20 mg via ORAL
  Filled 2019-04-22: qty 1

## 2019-04-22 NOTE — Discharge Instructions (Signed)
It was our pleasure to provide your ER care today - we hope that you feel better.  Your lab/xrays look good or normal.   Try acetaminophen or ibuprofen as need.   If reflux symptoms, try maalox or pepcid.   Follow up with primary care doctor in the next 1-2 weeks.   Return to ER if worse, new symptoms, fevers, trouble breathing, recurrent or severe chest pain, or other concern.

## 2019-04-22 NOTE — ED Triage Notes (Signed)
24 yo female c/o midsternal sharp intermittent chest pain that started at 5am. Worsens with deep breath but not movement. Denies PMH or home meds. A/Ox3. Skin w/d/pink. Resp wnl, equal and non-labored.

## 2019-04-22 NOTE — ED Provider Notes (Signed)
[facility] DEPT Provider Note   CSN: [id number] Arrival date & time: 04/22/19  8657     History Chief Complaint  Patient presents with  . Chest Pain    Adya Wirz is a 24 y.o. female.  Patient c/o mid chest pain at rest since around 5 AM. Symptoms acute onset, moderate, dull, constant, persistent, non radiating, occurred at rest/sleeping. No specific exacerbating or alleviating factors. No associated nv, diaphoresis or sob. No pleuritic pain. No other recent cp or discomfort of any sort, even w exertion. No unusual doe or fatigue. Occasional non prod cough. No sore throat. No body aches. No known covid + exposure. No fever or chills. Denies leg pain or swelling. No recent surgery, trauma or immobility. No hx dvt or pe. No fam hx premature cad. No chest wall trauma/strain. Denies hx gerd.   The history is provided by the patient.       History reviewed. No pertinent past medical history.  There are no problems to display for this patient.   History reviewed. No pertinent surgical history.   OB History   No obstetric history on file.     History reviewed. No pertinent family history.  Social History   Tobacco Use  . Smoking status: Not on file  Substance Use Topics  . Alcohol use: Not on file  . Drug use: Not on file    Home Medications Prior to Admission medications   Not on File    Allergies    Sulfamethoxazole-trimethoprim and Sulfa antibiotics  Review of Systems   Review of Systems  Constitutional: Negative for fever.  HENT: Negative for sore throat.   Eyes: Negative for redness.  Respiratory: Positive for cough. Negative for shortness of breath.   Cardiovascular: Positive for chest pain. Negative for palpitations and leg swelling.  Gastrointestinal: Negative for abdominal pain, nausea and vomiting.  Genitourinary: Negative for flank pain.  Musculoskeletal: Negative for back pain and neck pain.  Skin: Negative for rash.    Neurological: Negative for headaches.  Hematological: Does not bruise/bleed easily.  Psychiatric/Behavioral: Negative for confusion.    Physical Exam Updated Vital Signs BP 105/70   Pulse 60   Temp 98.1 F (36.7 C) (Oral)   Resp 20   Ht 1.626 m (5\' 4" )   Wt 73.9 kg   LMP 04/01/2019   SpO2 100%   BMI 27.98 kg/m   Physical Exam Vitals and nursing note reviewed.  Constitutional:      Appearance: Normal appearance. She is well-developed.  HENT:     Head: Atraumatic.     Nose: Nose normal.     Mouth/Throat:     Mouth: Mucous membranes are moist.  Eyes:     General: No scleral icterus.    Conjunctiva/sclera: Conjunctivae normal.  Neck:     Trachea: No tracheal deviation.  Cardiovascular:     Rate and Rhythm: Normal rate and regular rhythm.     Pulses: Normal pulses.     Heart sounds: Normal heart sounds. No murmur. No friction rub. No gallop.   Pulmonary:     Effort: Pulmonary effort is normal. No respiratory distress.     Breath sounds: Normal breath sounds.  Chest:     Chest wall: No tenderness.  Abdominal:     General: Bowel sounds are normal. There is no distension.     Palpations: Abdomen is soft.     Tenderness: There is no abdominal tenderness. There is no guarding.  Genitourinary:  Comments: No cva tenderness.  Musculoskeletal:        General: No swelling or tenderness.     Cervical back: Normal range of motion and neck supple. No rigidity. No muscular tenderness.     Right lower leg: No edema.     Left lower leg: No edema.  Skin:    General: Skin is warm and dry.     Findings: No rash.  Neurological:     Mental Status: She is alert.     Comments: Alert, speech normal.   Psychiatric:        Mood and Affect: Mood normal.     ED Results / Procedures / Treatments   Labs (all labs ordered are listed, but only abnormal results are displayed) Results for orders placed or performed during the hospital encounter of 04/22/19  Troponin I (High  Sensitivity)  Result Value Ref Range   Troponin I (High Sensitivity) <2 <18 ng/L   DG Chest Port 1 View  Result Date: 04/22/2019 CLINICAL DATA:  Cough and chest pain EXAM: PORTABLE CHEST 1 VIEW COMPARISON:  None. FINDINGS: Lungs are clear. Heart size and pulmonary vascularity are normal. No adenopathy. No pneumothorax. No bone lesions. IMPRESSION: Lungs clear.  Cardiac silhouette within normal limits. Electronically Signed   By: Bretta Bang III M.D.   On: 04/22/2019 10:26    EKG EKG Interpretation  Date/Time:  Monday April 22 2019 09:27:35 EDT Ventricular Rate:  67 PR Interval:    QRS Duration: 82 QT Interval:  370 QTC Calculation: 391 R Axis:   47 Text Interpretation: Sinus arrhythmia Baseline wander No previous tracing Confirmed by Cathren Laine ([id number]) on 04/22/2019 9:41:09 AM   Radiology DG Chest Port 1 View  Result Date: 04/22/2019 CLINICAL DATA:  Cough and chest pain EXAM: PORTABLE CHEST 1 VIEW COMPARISON:  None. FINDINGS: Lungs are clear. Heart size and pulmonary vascularity are normal. No adenopathy. No pneumothorax. No bone lesions. IMPRESSION: Lungs clear.  Cardiac silhouette within normal limits. Electronically Signed   By: Bretta Bang III M.D.   On: 04/22/2019 10:26    Procedures Procedures (including critical care time)  Medications Ordered in ED Medications  alum & mag hydroxide-simeth (MAALOX/MYLANTA) 200-200-20 MG/5ML suspension 30 mL (30 mLs Oral Given 04/22/19 0958)  famotidine (PEPCID) tablet 20 mg (20 mg Oral Given 04/22/19 0959)  acetaminophen (TYLENOL) tablet 1,000 mg (1,000 mg Oral Given 04/22/19 9629)    ED Course  I have reviewed the triage vital signs and the nursing notes.  Pertinent labs & imaging results that were available during my care of the patient were reviewed by me and considered in my medical decision making (see chart for details).    MDM Rules/Calculators/A&P                      Labs, ecg. Cxr.   Pepcid po, maalox po,  acetaminophen po for symptom relief.  Reviewed nursing notes and prior charts for additional history.   Labs reviewed/interpreted by me - trop, drawn 5 hrs after constant symptoms, normal - felt not c/w ACS.   CXR reviewed/interpreted by me - no pna.   Recheck pt comfortable, no increased wob, no pain/discomfort.  Patient currently appears stable for d/c.   Rec pcp f/u.  Return precautions provided.        Final Clinical Impression(s) / ED Diagnoses Final diagnoses:  None    Rx / DC Orders ED Discharge Orders    None  Cathren Laine, MD 04/22/19 1123

## 2019-07-06 ENCOUNTER — Emergency Department (HOSPITAL_BASED_OUTPATIENT_CLINIC_OR_DEPARTMENT_OTHER): Payer: Medicaid Other

## 2019-07-06 ENCOUNTER — Emergency Department (HOSPITAL_BASED_OUTPATIENT_CLINIC_OR_DEPARTMENT_OTHER)
Admission: EM | Admit: 2019-07-06 | Discharge: 2019-07-06 | Disposition: A | Discharge status: Home / Self Care | Payer: Medicaid Other | Attending: Emergency Medicine | Admitting: Emergency Medicine

## 2019-07-06 ENCOUNTER — Other Ambulatory Visit: Payer: Self-pay

## 2019-07-06 ENCOUNTER — Encounter (HOSPITAL_BASED_OUTPATIENT_CLINIC_OR_DEPARTMENT_OTHER): Payer: Self-pay | Admitting: Emergency Medicine

## 2019-07-06 DIAGNOSIS — B9689 Other specified bacterial agents as the cause of diseases classified elsewhere: Secondary | ICD-10-CM | POA: Diagnosis not present

## 2019-07-06 DIAGNOSIS — N831 Corpus luteum cyst of ovary, unspecified side: Secondary | ICD-10-CM | POA: Insufficient documentation

## 2019-07-06 DIAGNOSIS — N760 Acute vaginitis: Secondary | ICD-10-CM | POA: Insufficient documentation

## 2019-07-06 DIAGNOSIS — R102 Pelvic and perineal pain: Secondary | ICD-10-CM

## 2019-07-06 DIAGNOSIS — R1032 Left lower quadrant pain: Secondary | ICD-10-CM | POA: Diagnosis present

## 2019-07-06 LAB — CBC WITH DIFFERENTIAL/PLATELET
Abs Immature Granulocytes: 0.01 10*3/uL (ref 0.00–0.07)
Basophils Absolute: 0 10*3/uL (ref 0.0–0.1)
Basophils Relative: 0 %
Eosinophils Absolute: 0 10*3/uL (ref 0.0–0.5)
Eosinophils Relative: 0 %
HCT: 39.6 % (ref 36.0–46.0)
Hemoglobin: 13.6 g/dL (ref 12.0–15.0)
Immature Granulocytes: 0 %
Lymphocytes Relative: 18 %
Lymphs Abs: 1.5 10*3/uL (ref 0.7–4.0)
MCH: 28.8 pg (ref 26.0–34.0)
MCHC: 34.3 g/dL (ref 30.0–36.0)
MCV: 83.7 fL (ref 80.0–100.0)
Monocytes Absolute: 0.7 10*3/uL (ref 0.1–1.0)
Monocytes Relative: 8 %
Neutro Abs: 6.2 10*3/uL (ref 1.7–7.7)
Neutrophils Relative %: 74 %
Platelets: 251 10*3/uL (ref 150–400)
RBC: 4.73 MIL/uL (ref 3.87–5.11)
RDW: 13.7 % (ref 11.5–15.5)
WBC: 8.4 10*3/uL (ref 4.0–10.5)
nRBC: 0 % (ref 0.0–0.2)

## 2019-07-06 LAB — URINALYSIS, MICROSCOPIC (REFLEX): RBC / HPF: NONE SEEN RBC/hpf (ref 0–5)

## 2019-07-06 LAB — COMPREHENSIVE METABOLIC PANEL
ALT: 14 U/L (ref 0–44)
AST: 16 U/L (ref 15–41)
Albumin: 4.4 g/dL (ref 3.5–5.0)
Alkaline Phosphatase: 76 U/L (ref 38–126)
Anion gap: 10 (ref 5–15)
BUN: 10 mg/dL (ref 6–20)
CO2: 21 mmol/L — ABNORMAL LOW (ref 22–32)
Calcium: 9.4 mg/dL (ref 8.9–10.3)
Chloride: 104 mmol/L (ref 98–111)
Creatinine, Ser: 0.8 mg/dL (ref 0.44–1.00)
GFR calc Af Amer: >60 mL/min (ref >60)
GFR calc non Af Amer: >60 mL/min (ref >60)
Glucose, Bld: 89 mg/dL (ref 70–99)
Potassium: 3.7 mmol/L (ref 3.5–5.1)
Sodium: 135 mmol/L (ref 135–145)
Total Bilirubin: 0.8 mg/dL (ref 0.3–1.2)
Total Protein: 7.7 g/dL (ref 6.5–8.1)

## 2019-07-06 LAB — PREGNANCY, URINE: Preg Test, Ur: NEGATIVE

## 2019-07-06 LAB — URINALYSIS, ROUTINE W REFLEX MICROSCOPIC
Bilirubin Urine: NEGATIVE
Glucose, UA: NEGATIVE mg/dL
Hgb urine dipstick: NEGATIVE
Ketones, ur: 15 mg/dL — AB
Nitrite: NEGATIVE
Protein, ur: NEGATIVE mg/dL
Specific Gravity, Urine: 1.02 (ref 1.005–1.030)
pH: 6 (ref 5.0–8.0)

## 2019-07-06 LAB — WET PREP, GENITAL
Sperm: NONE SEEN
Trich, Wet Prep: NONE SEEN
Yeast Wet Prep HPF POC: NONE SEEN

## 2019-07-06 LAB — LIPASE, BLOOD: Lipase: 28 U/L (ref 11–51)

## 2019-07-06 MED ORDER — METOCLOPRAMIDE HCL 10 MG PO TABS: 10.0000 mg | ORAL_TABLET | Freq: Four times a day (QID) | ORAL | 0 refills | Status: DC | PRN

## 2019-07-06 MED ORDER — METRONIDAZOLE 500 MG PO TABS: 500.0000 mg | ORAL_TABLET | Freq: Two times a day (BID) | ORAL | 0 refills | Status: DC

## 2019-07-06 MED ORDER — FENTANYL CITRATE (PF) 100 MCG/2ML IJ SOLN
50.0000 ug | Freq: Once | INTRAMUSCULAR | Status: AC
  Administered 2019-07-06: 50 ug via INTRAVENOUS
  Filled 2019-07-06: qty 2

## 2019-07-06 MED ORDER — DICYCLOMINE HCL 20 MG PO TABS: 20.0000 mg | ORAL_TABLET | Freq: Two times a day (BID) | ORAL | 0 refills | Status: DC

## 2019-07-06 MED ORDER — ONDANSETRON HCL 4 MG/2ML IJ SOLN
4.0000 mg | Freq: Once | INTRAMUSCULAR | Status: AC
  Administered 2019-07-06: 4 mg via INTRAVENOUS
  Filled 2019-07-06: qty 2

## 2019-07-06 MED ORDER — SODIUM CHLORIDE 0.9 % IV BOLUS
1000.0000 mL | Freq: Once | INTRAVENOUS | Status: AC
  Administered 2019-07-06: 1000 mL via INTRAVENOUS

## 2019-07-06 NOTE — ED Provider Notes (Signed)
[place] EMERGENCY DEPARTMENT Provider Note   CSN: [id number] Arrival date & time: 07/06/19  1448     History Chief Complaint  Patient presents with   Abdominal Pain    Rachael Phillips is a 24 y.o. female.  Patient is a 24 year old female who presents with abdominal pain.  She reports about a 5-day history of pain in her left mid abdomen.  She has had some associated nausea and vomiting.  No change in bowels.  No urinary symptoms.  No known fevers.  No vaginal bleeding or discharge.  She says is been intermittent but lately has been more constant.  It is nonradiating.  She states she was seen at [facility] emergency department 4 days ago and had a CT scan of her abdomen.  She said that there was some enlargement of her intestine but otherwise was unremarkable.  She was told she had a viral infection.  She was given a prescription for antiemetic and ketorolac which she says has not been helping.  She comes in today with worsening symptoms.  She has a prior history of cesarean section but no other abdominal surgeries.        History reviewed. No pertinent past medical history.  There are no problems to display for this patient.   Past Surgical History:  Procedure Laterality Date   CESAREAN SECTION       OB History   No obstetric history on file.     No family history on file.  Social History   Tobacco Use   Smoking status: Never Smoker   Smokeless tobacco: Never Used  Substance Use Topics   Alcohol use: Yes   Drug use: Not Currently    Home Medications Prior to Admission medications   Medication Sig Start Date End Date Taking? Authorizing Provider  dicyclomine (BENTYL) 20 MG tablet Take 1 tablet (20 mg total) by mouth 2 (two) times daily. 07/06/19   Malvin Johns, MD  metoCLOPramide (REGLAN) 10 MG tablet Take 1 tablet (10 mg total) by mouth every 6 (six) hours as needed for nausea (nausea/headache). 07/06/19   Malvin Johns, MD  metroNIDAZOLE (FLAGYL)  500 MG tablet Take 1 tablet (500 mg total) by mouth 2 (two) times daily. One po bid x 7 days 07/06/19   Malvin Johns, MD    Allergies    Sulfamethoxazole-trimethoprim and Sulfa antibiotics  Review of Systems   Review of Systems  Constitutional: Negative for chills, diaphoresis, fatigue and fever.  HENT: Negative for congestion, rhinorrhea and sneezing.   Eyes: Negative.   Respiratory: Negative for cough, chest tightness and shortness of breath.   Cardiovascular: Negative for chest pain and leg swelling.  Gastrointestinal: Positive for abdominal pain, nausea and vomiting. Negative for blood in stool and diarrhea.  Genitourinary: Negative for difficulty urinating, flank pain, frequency and hematuria.  Musculoskeletal: Negative for arthralgias and back pain.  Skin: Negative for rash.  Neurological: Negative for dizziness, speech difficulty, weakness, numbness and headaches.    Physical Exam Updated Vital Signs BP 119/74 (BP Location: Left Arm)    Pulse (!) 53    Temp 99.4 F (37.4 C) (Oral)    Resp 18    Ht 5\' 4"  (1.626 m)    Wt 72.6 kg    LMP 06/16/2019    SpO2 100%    BMI 27.46 kg/m   Physical Exam Constitutional:      Appearance: She is well-developed.  HENT:     Head: Normocephalic and atraumatic.  Eyes:  Pupils: Pupils are equal, round, and reactive to light.  Cardiovascular:     Rate and Rhythm: Normal rate and regular rhythm.     Heart sounds: Normal heart sounds.  Pulmonary:     Effort: Pulmonary effort is normal. No respiratory distress.     Breath sounds: Normal breath sounds. No wheezing or rales.  Chest:     Chest wall: No tenderness.  Abdominal:     General: Bowel sounds are normal.     Palpations: Abdomen is soft.     Tenderness: There is abdominal tenderness in the suprapubic area and left lower quadrant. There is no guarding or rebound.  Genitourinary:    Comments: Pelvic exam shows moderate white discharge, no lesions noted.  Moderate discomfort in the  left adnexa, no cervical motion tenderness Musculoskeletal:        General: Normal range of motion.     Cervical back: Normal range of motion and neck supple.  Lymphadenopathy:     Cervical: No cervical adenopathy.  Skin:    General: Skin is warm and dry.     Findings: No rash.  Neurological:     Mental Status: She is alert and oriented to person, place, and time.     ED Results / Procedures / Treatments   Labs (all labs ordered are listed, but only abnormal results are displayed) Labs Reviewed  WET PREP, GENITAL - Abnormal; Notable for the following components:      Result Value   Clue Cells Wet Prep HPF POC PRESENT (*)    WBC, Wet Prep HPF POC MANY (*)    All other components within normal limits  URINALYSIS, ROUTINE W REFLEX MICROSCOPIC - Abnormal; Notable for the following components:   APPearance HAZY (*)    Ketones, ur 15 (*)    Leukocytes,Ua SMALL (*)    All other components within normal limits  COMPREHENSIVE METABOLIC PANEL - Abnormal; Notable for the following components:   CO2 21 (*)    All other components within normal limits  URINALYSIS, MICROSCOPIC (REFLEX) - Abnormal; Notable for the following components:   Bacteria, UA MANY (*)    All other components within normal limits  PREGNANCY, URINE  LIPASE, BLOOD  CBC WITH DIFFERENTIAL/PLATELET  GC/CHLAMYDIA PROBE AMP ([place]) NOT AT [facility]    EKG None  Radiology US PELVIC COMPLETE W TRANSVAGINAL AND TORSION R/O  Result Date: 07/06/2019 CLINICAL DATA:  Pelvic pain EXAM: TRANSABDOMINAL AND TRANSVAGINAL ULTRASOUND OF PELVIS DOPPLER ULTRASOUND OF OVARIES TECHNIQUE: Study was performed transabdominally to optimize pelvic field of view evaluation and transvaginally to optimize internal visceral architecture evaluation. Color and duplex Doppler ultrasound was utilized to evaluate blood flow to the ovaries. COMPARISON:  None. FINDINGS: Uterus Measurements: 7.8 x 3.9 x 4.5 cm = volume: 71 mL. No leiomyomatous change  or abnormality of the myometrial architecture. Endometrium Thickness: 15 mm.  No focal abnormality visualized. Right ovary Measurements: 3.8 x 2.1 x 2.1 cm = volume: 8.9 cm mL. There is a somewhat complex cystic mass in the right ovary measuring 1.6 x 1.4 x 1.2 cm. Color flow is seen surrounding this structure. Left ovary Measurements: 4.0 x 2.2 x 2.6 cm = volume: 12.1 mL. Somewhat complex cystic mass in the left ovary measuring 1.7 x 1.5 x 1.5 cm. Color flow is seen surrounding this structure. Pulsed Doppler evaluation of both ovaries demonstrates normal low-resistance arterial and venous waveforms. Other findings Small amount of free pelvic fluid. IMPRESSION: 1. Probable corpus luteum cyst in each  ovary with mildly inhomogeneous echotexture in each of these cystic lesions and hypervascularity surrounding these lesions. Given this finding, correlation with beta HCG value advised. 2. Low resistance waveform in each ovary. No findings suggesting ovarian torsion on either side. 3. Small amount of free pelvic fluid, a finding that may be indicative of recent ovarian cyst leakage or rupture. 4. No uterine lesion evident. Endometrium within normal limits in thickness for premenopausal state. Electronically Signed   By: Bretta Bang III M.D.   On: 07/06/2019 17:16    Procedures Procedures (including critical care time)  Medications Ordered in ED Medications  sodium chloride 0.9 % bolus 1,000 mL (0 mLs Intravenous Stopped 07/06/19 1717)  fentaNYL (SUBLIMAZE) injection 50 mcg (50 mcg Intravenous Given 07/06/19 1532)  ondansetron (ZOFRAN) injection 4 mg (4 mg Intravenous Given 07/06/19 1532)  fentaNYL (SUBLIMAZE) injection 50 mcg (50 mcg Intravenous Given 07/06/19 1713)    ED Course  I have reviewed the triage vital signs and the nursing notes.  Pertinent labs & imaging results that were available during my care of the patient were reviewed by me and considered in my medical decision making (see chart for  details).    MDM Rules/Calculators/A&P                      Patient presents with abdominal pain.  I was able to review the records from [facility] emergency room where she was seen on May 25.  At that time she had more upper abdominal pain.  She had a CT scan which showed some suggestions of enteritis and an ultrasound of her gallbladder which was negative.  Today she has more tenderness in her left lower quadrant.  On pelvic exam she was tender in her left adnexa.  She also has some discharge.  Ultrasound was performed which shows some corpus luteum cyst but otherwise unremarkable.  No evidence of torsion.  Her wet prep shows positive BV.  Her labs are nonconcerning.  Her pregnancy test is negative.  Her abdominal exam is nonconcerning.  She was treated with pain medicine and antiemetics in the ED.  She has had no vomiting.  She was discharged home in good condition.  She was given a prescription for Reglan and Bentyl as well as Flagyl for treatment of the BV.  Her symptoms could be a combination of the resolving enteritis and BV.  She does not currently have a PCP but was advised to either return to the emergency room or follow-up with a PCP if her symptoms are ongoing or certainly if she has any worsening symptoms. Final Clinical Impression(s) / ED Diagnoses Final diagnoses:  Left lower quadrant abdominal pain  BV (bacterial vaginosis)    Rx / DC Orders ED Discharge Orders         Ordered    metroNIDAZOLE (FLAGYL) 500 MG tablet  2 times daily     07/06/19 1816    dicyclomine (BENTYL) 20 MG tablet  2 times daily     07/06/19 1816    metoCLOPramide (REGLAN) 10 MG tablet  Every 6 hours PRN     07/06/19 1816           Rolan Bucco, MD 07/06/19 1820

## 2019-07-06 NOTE — ED Triage Notes (Signed)
Upper abd pain with vomiting since Tuesday. Was seen in the ED and told in was a virus but symptoms persist.

## 2019-07-09 LAB — GC/CHLAMYDIA PROBE AMP (~~LOC~~) NOT AT ARMC
Chlamydia: NEGATIVE
Comment: NEGATIVE
Comment: NORMAL
Neisseria Gonorrhea: NEGATIVE

## 2019-08-29 ENCOUNTER — Ambulatory Visit (HOSPITAL_COMMUNITY)
Admission: EM | Admit: 2019-08-29 | Discharge: 2019-08-29 | Disposition: A | Discharge status: Home / Self Care | Payer: Medicaid Other

## 2019-08-29 ENCOUNTER — Encounter (HOSPITAL_COMMUNITY): Payer: Self-pay | Admitting: Emergency Medicine

## 2019-08-29 ENCOUNTER — Other Ambulatory Visit: Payer: Self-pay

## 2019-08-29 ENCOUNTER — Emergency Department (HOSPITAL_COMMUNITY)
Admission: EM | Admit: 2019-08-29 | Discharge: 2019-08-29 | Disposition: A | Discharge status: Home / Self Care | Payer: Medicaid Other | Attending: Emergency Medicine | Admitting: Emergency Medicine

## 2019-08-29 ENCOUNTER — Emergency Department (HOSPITAL_COMMUNITY): Payer: Medicaid Other

## 2019-08-29 DIAGNOSIS — Z5321 Procedure and treatment not carried out due to patient leaving prior to being seen by health care provider: Secondary | ICD-10-CM | POA: Insufficient documentation

## 2019-08-29 DIAGNOSIS — R0602 Shortness of breath: Secondary | ICD-10-CM | POA: Insufficient documentation

## 2019-08-29 DIAGNOSIS — R21 Rash and other nonspecific skin eruption: Secondary | ICD-10-CM | POA: Diagnosis present

## 2019-08-29 NOTE — ED Triage Notes (Signed)
Pt states her arm began itching today while at work. She noticed a rash on both arms, which is what brought her in today. 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

## 2019-08-29 NOTE — ED Triage Notes (Signed)
Pt states she now feels like she can't breathe very well. She states she feels this in her chest but denies feeling like her tongue or throat is swelling.

## 2019-11-13 ENCOUNTER — Other Ambulatory Visit: Payer: Self-pay

## 2019-11-13 ENCOUNTER — Encounter: Payer: Self-pay | Admitting: Obstetrics and Gynecology

## 2019-11-13 ENCOUNTER — Other Ambulatory Visit (HOSPITAL_COMMUNITY)
Admission: RE | Admit: 2019-11-13 | Discharge: 2019-11-13 | Disposition: A | Discharge status: Home / Self Care | Payer: Medicaid Other | Source: Ambulatory Visit | Attending: Obstetrics and Gynecology | Admitting: Obstetrics and Gynecology

## 2019-11-13 ENCOUNTER — Ambulatory Visit (INDEPENDENT_AMBULATORY_CARE_PROVIDER_SITE_OTHER): Payer: Medicaid Other | Admitting: Obstetrics and Gynecology

## 2019-11-13 VITALS — BP 118/75 | HR 71 | Ht 64.0 in | Wt 161.0 lb

## 2019-11-13 DIAGNOSIS — Z01812 Encounter for preprocedural laboratory examination: Secondary | ICD-10-CM | POA: Diagnosis not present

## 2019-11-13 DIAGNOSIS — Z01419 Encounter for gynecological examination (general) (routine) without abnormal findings: Secondary | ICD-10-CM | POA: Insufficient documentation

## 2019-11-13 DIAGNOSIS — Z30430 Encounter for insertion of intrauterine contraceptive device: Secondary | ICD-10-CM

## 2019-11-13 LAB — POCT URINE PREGNANCY: Preg Test, Ur: NEGATIVE

## 2019-11-13 MED ORDER — LEVONORGESTREL 19.5 MCG/DAY IU IUD: INTRAUTERINE_SYSTEM | Freq: Once | INTRAUTERINE | Status: AC

## 2019-11-13 NOTE — Progress Notes (Signed)
Subjective:     Rachael Phillips is a 24 y.o. female P1011 with LMP 10/31/19 and BMI 27 who is here for a comprehensive physical exam. The patient reports no problems. Patient reports a monthly period lasting 5-7 days. She is sexually active using condoms. Patient was taking COC but admits to being forgetful and desires IUD instead. Patient denies pelvic pain or abnormal discharge  History reviewed. No pertinent past medical history. Past Surgical History:  Procedure Laterality Date  . CESAREAN SECTION     History reviewed. No pertinent family history.  Social History   Socioeconomic History  . Marital status: Single    Spouse name: Not on file  . Number of children: Not on file  . Years of education: Not on file  . Highest education level: Not on file  Occupational History  . Not on file  Tobacco Use  . Smoking status: Never Smoker  . Smokeless tobacco: Never Used  Substance and Sexual Activity  . Alcohol use: Yes  . Drug use: Not Currently  . Sexual activity: Yes    Birth control/protection: None  Other Topics Concern  . Not on file  Social History Narrative  . Not on file   Social Determinants of Health   Financial Resource Strain:   . Difficulty of Paying Living Expenses: Not on file  Food Insecurity:   . Worried About Programme researcher, broadcasting/film/video in the Last Year: Not on file  . Ran Out of Food in the Last Year: Not on file  Transportation Needs:   . Lack of Transportation (Medical): Not on file  . Lack of Transportation (Non-Medical): Not on file  Physical Activity:   . Days of Exercise per Week: Not on file  . Minutes of Exercise per Session: Not on file  Stress:   . Feeling of Stress : Not on file  Social Connections:   . Frequency of Communication with Friends and Family: Not on file  . Frequency of Social Gatherings with Friends and Family: Not on file  . Attends Religious Services: Not on file  . Active Member of Clubs or Organizations: Not on file  . Attends Tax inspector Meetings: Not on file  . Marital Status: Not on file  Intimate Partner Violence:   . Fear of Current or Ex-Partner: Not on file  . Emotionally Abused: Not on file  . Physically Abused: Not on file  . Sexually Abused: Not on file   Health Maintenance  Topic Date Due  . Hepatitis C Screening  Never done  . COVID-19 Vaccine (1) Never done  . HIV Screening  Never done  . PAP-Cervical Cytology Screening  Never done  . PAP SMEAR-Modifier  Never done  . INFLUENZA VACCINE  Never done  . TETANUS/TDAP  04/06/2028       Review of Systems Pertinent items noted in HPI and remainder of comprehensive ROS otherwise negative.   Objective:  Blood pressure 118/75, pulse 71, height 5\' 4"  (1.626 m), weight 161 lb (73 kg), last menstrual period 10/31/2019.     GENERAL: Well-developed, well-nourished female in no acute distress.  HEENT: Normocephalic, atraumatic. Sclerae anicteric.  NECK: Supple. Normal thyroid.  LUNGS: Clear to auscultation bilaterally.  HEART: Regular rate and rhythm. BREASTS: Symmetric in size. No palpable masses or lymphadenopathy, skin changes, or nipple drainage. ABDOMEN: Soft, nontender, nondistended. No organomegaly. PELVIC: Normal external female genitalia. Vagina is pink and rugated.  Normal discharge. Normal appearing cervix. Uterus is normal in size. No adnexal  mass or tenderness. EXTREMITIES: No cyanosis, clubbing, or edema, 2+ distal pulses.    Assessment:    Healthy female exam.      Plan:    Pap smear collected Patient declined STI testing IUD Procedure Note Patient identified, informed consent performed, signed copy in chart, time out was performed.  Urine pregnancy test negative.  Speculum placed in the vagina.  Cervix visualized.  Cleaned with Betadine x 2.  Grasped anteriorly with a single tooth tenaculum.  Uterus sounded to 8 cm.  Liletta IUD placed per manufacturer's recommendations.  Strings trimmed to 3 cm. Tenaculum was removed, good  hemostasis noted.  Patient tolerated procedure well.   Patient given post procedure instructions and Liletta care card with expiration date.  Patient is asked to check IUD strings periodically and follow up in 4-6 weeks for IUD check.   See After Visit Summary for Counseling Recommendations

## 2019-11-13 NOTE — Progress Notes (Addendum)
New GYN presents AEX and BC Consult.  C/o CS incision itches almost every day.  UPT today is NEGATIVE.  Administrations This Visit    levonorgestrel (LILETTA) 19.5 MCG/DAY IUD    Admin Date 11/13/2019 Action Given Dose  Route Intrauterine Administered By Maretta Bees, RMA

## 2019-11-14 LAB — CYTOLOGY - PAP: Diagnosis: NEGATIVE

## 2019-12-11 ENCOUNTER — Ambulatory Visit: Payer: Medicaid Other | Admitting: Advanced Practice Midwife

## 2020-04-09 ENCOUNTER — Emergency Department (HOSPITAL_COMMUNITY)
Admission: EM | Admit: 2020-04-09 | Discharge: 2020-04-09 | Disposition: A | Discharge status: Home / Self Care | Payer: Worker's Compensation | Attending: Emergency Medicine | Admitting: Emergency Medicine

## 2020-04-09 ENCOUNTER — Emergency Department (HOSPITAL_COMMUNITY): Payer: Worker's Compensation

## 2020-04-09 ENCOUNTER — Other Ambulatory Visit: Payer: Self-pay

## 2020-04-09 DIAGNOSIS — W010XXA Fall on same level from slipping, tripping and stumbling without subsequent striking against object, initial encounter: Secondary | ICD-10-CM | POA: Diagnosis not present

## 2020-04-09 DIAGNOSIS — S060X0A Concussion without loss of consciousness, initial encounter: Secondary | ICD-10-CM | POA: Insufficient documentation

## 2020-04-09 DIAGNOSIS — Y990 Civilian activity done for income or pay: Secondary | ICD-10-CM | POA: Insufficient documentation

## 2020-04-09 DIAGNOSIS — S0990XA Unspecified injury of head, initial encounter: Secondary | ICD-10-CM

## 2020-04-09 MED ORDER — ACETAMINOPHEN 500 MG PO TABS: 1000.0000 mg | ORAL_TABLET | Freq: Four times a day (QID) | ORAL | 0 refills | Status: AC | PRN

## 2020-04-09 MED ORDER — ONDANSETRON HCL 4 MG PO TABS: 4.0000 mg | ORAL_TABLET | Freq: Three times a day (TID) | ORAL | 0 refills | Status: AC | PRN

## 2020-04-09 NOTE — ED Provider Notes (Signed)
[facility] EMERGENCY DEPARTMENT Provider Note   CSN: [id number] Arrival date & time: 04/09/20  1101     History No chief complaint on file.   Rachael Phillips is a 25 y.o. female.  The history is provided by the patient. No language interpreter was used.     25 year old female presenting for evaluation of a fall.  Pt was at work today when she fell. 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  She denies any loss of consciousness.  She does endorse feeling nauseous, and was short.  Initially pain was 8 out of 10 but it has improved to 4 out of 10.  She denies confusion, neck pain, focal numbness or focal weakness or any other injury.  She is not on any blood thinning medication.  No specific treatment tried.  No precipitating symptoms prior to the fall.  No vomiting.  Past Medical History:  Diagnosis Date  . Anemia   . Anxiety   . Depression   . Headache   . Vaginal Pap smear, abnormal     There are no problems to display for this patient.   Past Surgical History:  Procedure Laterality Date  . CESAREAN SECTION       OB History    Gravida  3   Para  1   Term  1   Preterm      AB  1   Living  1     SAB      IAB      Ectopic      Multiple      Live Births              Family History  Problem Relation Age of Onset  . Diabetes Mother   . Hypertension Mother   . Cancer Maternal Grandmother   . Diabetes Maternal Grandmother   . Hearing loss Maternal Grandfather     Social History   Tobacco Use  . Smoking status: Never Smoker  . Smokeless tobacco: Never Used  Vaping Use  . Vaping Use: Some days  Substance Use Topics  . Alcohol use: Yes  . Drug use: Not Currently    Home Medications Prior to Admission medications   Medication Sig Start Date End Date Taking? Authorizing Provider  butalbital-acetaminophen-caffeine (FIORICET) 50-325-40 MG tablet Take 1  tablet by mouth every 4 (four) hours as needed. 10/18/19   [provider]  dicyclomine (BENTYL) 20 MG tablet Take 1 tablet (20 mg total) by mouth 2 (two) times daily. Patient not taking: Reported on 11/13/2019 07/06/19   Rolan Bucco, MD  metoCLOPramide (REGLAN) 10 MG tablet Take 1 tablet (10 mg total) by mouth every 6 (six) hours as needed for nausea (nausea/headache). Patient not taking: Reported on 11/13/2019 07/06/19   Rolan Bucco, MD  metroNIDAZOLE (FLAGYL) 500 MG tablet Take 1 tablet (500 mg total) by mouth 2 (two) times daily. One po bid x 7 days Patient not taking: Reported on 11/13/2019 07/06/19   Rolan Bucco, MD    Allergies    Sulfamethoxazole-trimethoprim and Sulfa antibiotics  Review of Systems   Review of Systems  All other systems reviewed and are negative.   Physical Exam Updated Vital Signs BP 130/82 (BP Location: Right Arm)   Pulse (!) 57   Temp 98.6 F (37 C) (Oral)   Resp 14   SpO2 100%   Physical Exam Vitals  and nursing note reviewed.  Constitutional:      General: She is not in acute distress.    Appearance: She is well-developed and well-nourished.  HENT:     Head: Normocephalic and atraumatic.     Comments: Mild tenderness to occipital scalp without any bruising crepitus or laceration noted Eyes:     Extraocular Movements: Extraocular movements intact.     Conjunctiva/sclera: Conjunctivae normal.     Pupils: Pupils are equal, round, and reactive to light.  Cardiovascular:     Rate and Rhythm: Normal rate and regular rhythm.     Pulses: Normal pulses.     Heart sounds: Normal heart sounds.  Pulmonary:     Effort: Pulmonary effort is normal.     Breath sounds: Normal breath sounds.  Musculoskeletal:     Cervical back: Neck supple.  Skin:    Findings: No rash.  Neurological:     Mental Status: She is alert and oriented to person, place, and time.     GCS: GCS eye subscore is 4. GCS verbal subscore is 5. GCS motor subscore is 6.      Cranial Nerves: Cranial nerves are intact.     Sensory: Sensation is intact.     Motor: Motor function is intact.     Gait: Gait is intact.  Psychiatric:        Mood and Affect: Mood and affect normal.     ED Results / Procedures / Treatments   Labs (all labs ordered are listed, but only abnormal results are displayed) Labs Reviewed - No data to display  EKG None  Radiology CT Head Wo Contrast  Result Date: 04/09/2020 CLINICAL DATA:  Fall and hit the back of her head this morning nausea and dizziness after EXAM: CT HEAD WITHOUT CONTRAST TECHNIQUE: Contiguous axial images were obtained from the base of the skull through the vertex without intravenous contrast. COMPARISON:  None. FINDINGS: Brain: No evidence of acute infarction, hemorrhage, hydrocephalus, extra-axial collection or mass lesion/mass effect. Vascular: No hyperdense vessel or unexpected calcification. Skull: Normal. Negative for fracture or focal lesion. Sinuses/Orbits: No acute finding. Other: None. IMPRESSION: No acute intracranial pathology. Electronically Signed   By: Maudry Mayhew MD   On: 04/09/2020 12:49    Procedures Procedures   Medications Ordered in ED Medications - No data to display  ED Course  I have reviewed the triage vital signs and the nursing notes.  Pertinent labs & imaging results that were available during my care of the patient were reviewed by me and considered in my medical decision making (see chart for details).    MDM Rules/Calculators/A&P                          BP 130/82 (BP Location: Right Arm)   Pulse (!) 57   Temp 98.6 F (37 C) (Oral)   Resp 14   SpO2 100%   Final Clinical Impression(s) / ED Diagnoses Final diagnoses:  Minor head injury, initial encounter  Concussion without loss of consciousness, initial encounter    Rx / DC Orders ED Discharge Orders    None     3:49 PM Patient had mechanical fall and struck her head no loss of consciousness.  She did have some  nausea and dizziness afterward.  Head CT scan unremarkable.  Discussed Management for concussive symptoms.  Patient is stable for discharge.   Fayrene Helper, PA-C 04/09/20 1554    Mancel Bale, MD 04/11/20  1640  

## 2020-04-09 NOTE — ED Triage Notes (Signed)
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

## 2020-08-12 ENCOUNTER — Ambulatory Visit: Payer: Medicaid Other | Admitting: Obstetrics and Gynecology

## 2020-08-26 ENCOUNTER — Ambulatory Visit: Payer: Medicaid Other | Admitting: Women's Health

## 2020-09-28 ENCOUNTER — Ambulatory Visit: Payer: Medicaid Other | Admitting: Obstetrics and Gynecology

## 2020-12-02 ENCOUNTER — Other Ambulatory Visit: Payer: Self-pay

## 2020-12-02 ENCOUNTER — Encounter (HOSPITAL_COMMUNITY): Payer: Self-pay

## 2020-12-02 ENCOUNTER — Ambulatory Visit (HOSPITAL_COMMUNITY)
Admission: EM | Admit: 2020-12-02 | Discharge: 2020-12-02 | Disposition: A | Discharge status: Home / Self Care | Payer: Medicaid Other | Attending: Emergency Medicine | Admitting: Emergency Medicine

## 2020-12-02 DIAGNOSIS — J029 Acute pharyngitis, unspecified: Secondary | ICD-10-CM | POA: Insufficient documentation

## 2020-12-02 LAB — POCT RAPID STREP A, ED / UC: Streptococcus, Group A Screen (Direct): NEGATIVE

## 2020-12-02 MED ORDER — PREDNISONE 10 MG (21) PO TBPK: ORAL_TABLET | Freq: Every day | ORAL | 0 refills | Status: AC

## 2020-12-02 NOTE — ED Triage Notes (Signed)
Pt states woke up this morning with sore throat, swollen tonsils, and hoarse voice.

## 2020-12-02 NOTE — Discharge Instructions (Addendum)
We will send off your streph test if positive we will call you with treatment  Take motrin or tylenol for pain  Can use warm salt water gargle to help

## 2020-12-02 NOTE — ED Provider Notes (Signed)
[facility]    CSN: [id number] Arrival date & time: 12/02/20  9675      History   Chief Complaint Chief Complaint  Patient presents with   Sore Throat    HPI Rachael Phillips is a 25 y.o. female.   Awaken this am for sore throat and hoarseness. Denies any fever, no cough, no congestion. Has not taken anything pta. No one at home is ill. 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    Past Medical History:  Diagnosis Date   Anemia    Anxiety    Depression    Headache    Vaginal Pap smear, abnormal     There are no problems to display for this patient.   Past Surgical History:  Procedure Laterality Date   CESAREAN SECTION      OB History     Gravida  3   Para  1   Term  1   Preterm      AB  1   Living  1      SAB      IAB      Ectopic      Multiple      Live Births               Home Medications    Prior to Admission medications   Medication Sig Start Date End Date Taking? Authorizing Provider  predniSONE (STERAPRED UNI-PAK 21 TAB) 10 MG (21) TBPK tablet Take by mouth daily. Take 6 tabs by mouth daily  for 2 days, then 5 tabs for 2 days, then 4 tabs for 2 days, then 3 tabs for 2 days, 2 tabs for 2 days, then 1 tab by mouth daily for 2 days 12/02/20  Yes Coralyn Mark, NP  acetaminophen (TYLENOL) 500 MG tablet Take 2 tablets (1,000 mg total) by mouth every 6 (six) hours as needed for headache. 04/09/20   Fayrene Helper, PA-C  butalbital-acetaminophen-caffeine (FIORICET) [id number] MG tablet Take 1 tablet by mouth every 4 (four) hours as needed. 10/18/19   [provider]  hydrOXYzine (ATARAX/VISTARIL) 25 MG tablet Take 25 mg by mouth every 6 (six) hours as needed. 08/06/20   [provider]  ondansetron (ZOFRAN) 4 MG tablet Take 1 tablet (4 mg total) by mouth every 8 (eight) hours as needed for nausea or vomiting. 04/09/20   Fayrene Helper, PA-C  dicyclomine (BENTYL) 20 MG tablet Take 1 tablet (20 mg total) by mouth 2 (two) times  daily. Patient not taking: Reported on 11/13/2019 07/06/19 04/09/20  Rolan Bucco, MD  metoCLOPramide (REGLAN) 10 MG tablet Take 1 tablet (10 mg total) by mouth every 6 (six) hours as needed for nausea (nausea/headache). Patient not taking: Reported on 11/13/2019 07/06/19 04/09/20  Rolan Bucco, MD    Family History Family History  Problem Relation Age of Onset   Diabetes Mother    Hypertension Mother    Cancer Maternal Grandmother    Diabetes Maternal Grandmother    Hearing loss Maternal Grandfather     Social History Social History   Tobacco Use   Smoking status: Never   Smokeless tobacco: Never  Vaping Use   Vaping Use: Some days  Substance Use Topics   Alcohol use: Yes   Drug use: Not Currently     Allergies   Sulfamethoxazole-trimethoprim and Sulfa antibiotics   Review of Systems Review of Systems  Constitutional:  Negative for chills, fatigue and fever.  HENT:  Positive for sore throat. Negative for  congestion, ear pain, postnasal drip and sneezing.   Eyes: Negative.   Respiratory:  Negative for cough and shortness of breath.   Cardiovascular: Negative.   Gastrointestinal: Negative.   Genitourinary: Negative.   Skin: Negative.   Neurological: Negative.     Physical Exam Triage Vital Signs ED Triage Vitals [12/02/20 0912]  Enc Vitals Group     BP 122/79     Pulse Rate 88     Resp 18     Temp 98.5 F (36.9 C)     Temp Source Oral     SpO2 97 %     Weight      Height      Head Circumference      Peak Flow      Pain Score 5     Pain Loc      Pain Edu?      Excl. in GC?    No data found.  Updated Vital Signs BP 122/79 (BP Location: Left Arm)   Pulse 88   Temp 98.5 F (36.9 C) (Oral)   Resp 18   SpO2 97%   Visual Acuity Right Eye Distance:   Left Eye Distance:   Bilateral Distance:    Right Eye Near:   Left Eye Near:    Bilateral Near:     Physical Exam Constitutional:      Appearance: She is well-developed.  HENT:     Right Ear:  Tympanic membrane normal.     Left Ear: Tympanic membrane normal.     Nose: Congestion present.     Mouth/Throat:     Pharynx: Pharyngeal swelling and posterior oropharyngeal erythema present.     Tonsils: 1+ on the right. 1+ on the left.  Eyes:     Conjunctiva/sclera: Conjunctivae normal.     Pupils: Pupils are equal, round, and reactive to light.  Cardiovascular:     Rate and Rhythm: Normal rate.  Pulmonary:     Effort: Pulmonary effort is normal.  Abdominal:     Palpations: Abdomen is soft.  Musculoskeletal:     Cervical back: Normal range of motion.  Skin:    General: Skin is warm.  Neurological:     Mental Status: She is alert.     UC Treatments / Results  Labs (all labs ordered are listed, but only abnormal results are displayed) Labs Reviewed  CULTURE, GROUP A STREP [facility])  POCT RAPID STREP A, ED / UC    EKG   Radiology No results found.  Procedures Procedures (including critical care time)  Medications Ordered in UC Medications - No data to display  Initial Impression / Assessment and Plan / UC Course  I have reviewed the triage vital signs and the nursing notes.  Pertinent labs & imaging results that were available during my care of the patient were reviewed by me and considered in my medical decision making (see chart for details).     We will send off your streph test if positive we will call you with treatment  Take motrin or tylenol for pain  Can use warm salt water gargle to help    Final Clinical Impressions(s) / UC Diagnoses   Final diagnoses:  Viral pharyngitis     Discharge Instructions      We will send off your streph test if positive we will call you with treatment  Take motrin or tylenol for pain  Can use warm salt water gargle to help      ED  Prescriptions     Medication Sig Dispense Auth. Provider   predniSONE (STERAPRED UNI-PAK 21 TAB) 10 MG (21) TBPK tablet Take by mouth daily. Take 6 tabs by mouth daily  for 2  days, then 5 tabs for 2 days, then 4 tabs for 2 days, then 3 tabs for 2 days, 2 tabs for 2 days, then 1 tab by mouth daily for 2 days 42 tablet Coralyn Mark, NP      PDMP not reviewed this encounter.   Coralyn Mark, NP 12/02/20 1003

## 2020-12-04 LAB — CULTURE, GROUP A STREP (THRC)

## 2022-05-06 IMAGING — CT CT HEAD W/O CM
4 series · 16 of 47 positions shown, 18 images · non-contrast
Comparison: None.

CLINICAL DATA: Fall and hit the back of her head this morning
nausea and dizziness after

EXAM:
CT HEAD WITHOUT CONTRAST
TECHNIQUE: Contiguous axial images were obtained from the base of the skull
through the vertex without intravenous contrast.

[Series 2: head wo · axial · 0.39mm/px · z∈[+1196,+1310]mm · 7 of 31 slices shown, 9 images]
[im 4/31  brain]
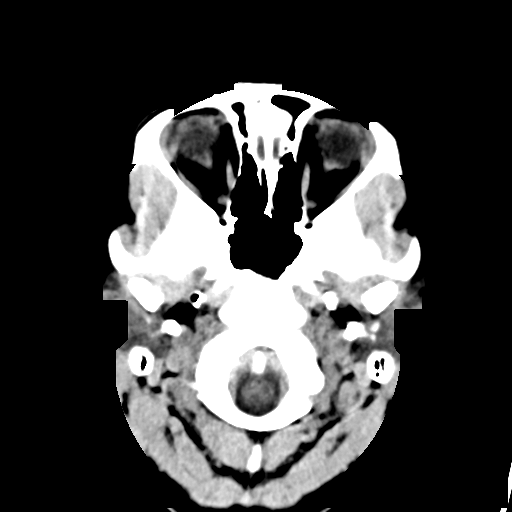
[im 4/31  bone]
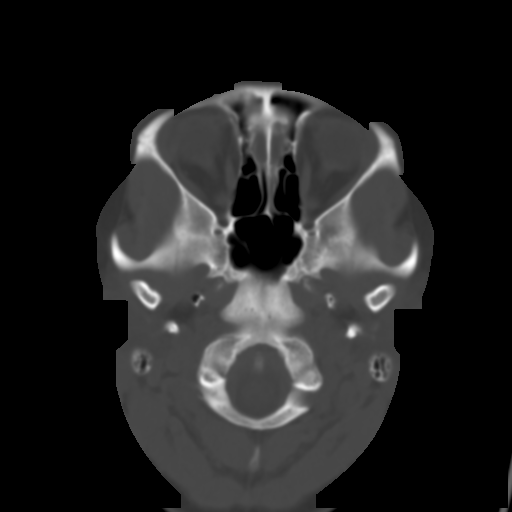
[im 8/31  brain]
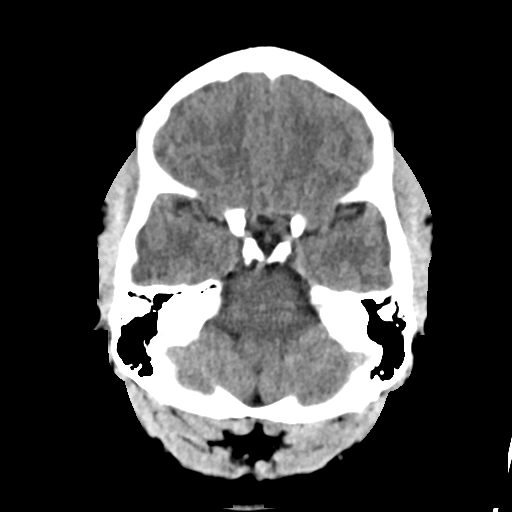
[im 12/31  brain]
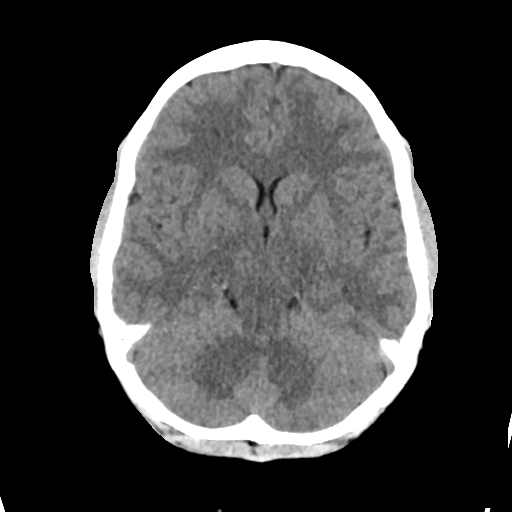
[im 16/31  brain]
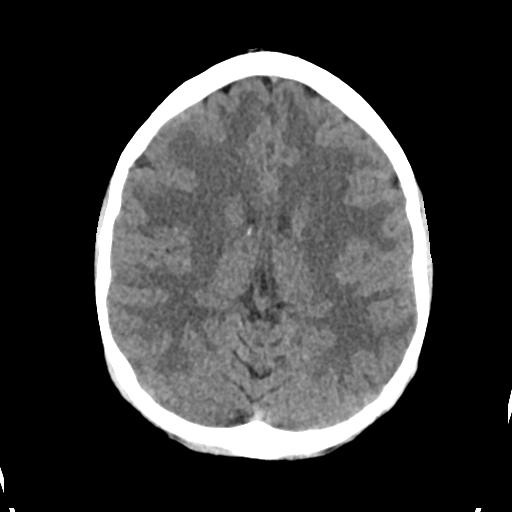
[im 19/31  brain]
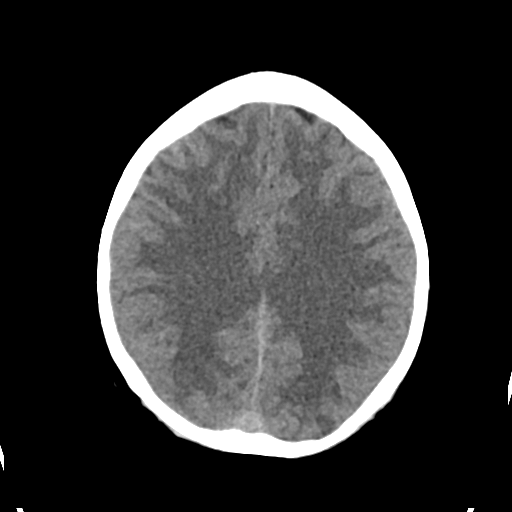
[im 19/31  bone]
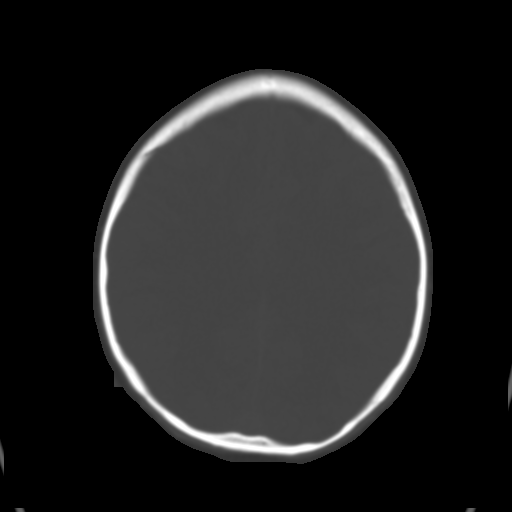
[im 23/31  brain]
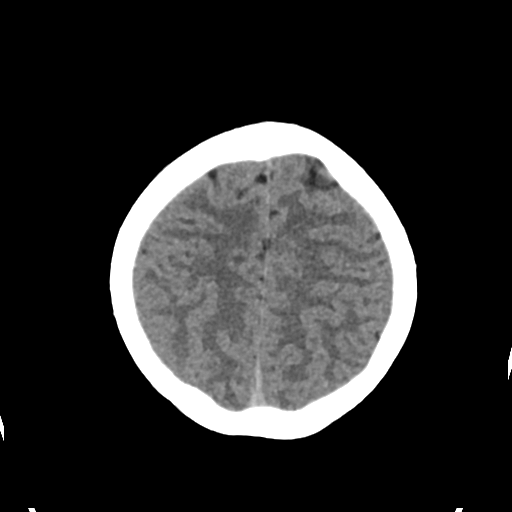
[im 27/31  brain]
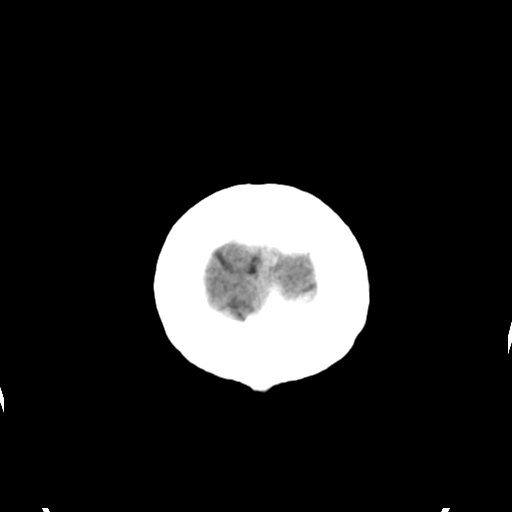

[Series 3: head bone · axial · 0.39mm/px · z∈[+1194,+1226]mm · 3 of 78 slices shown]
[im 8/78  bone]
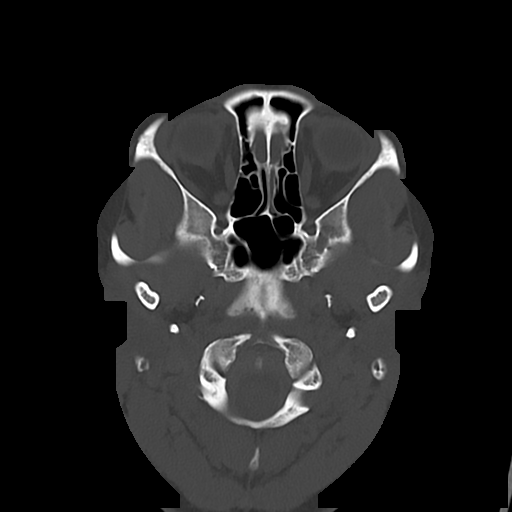
[im 16/78  bone]
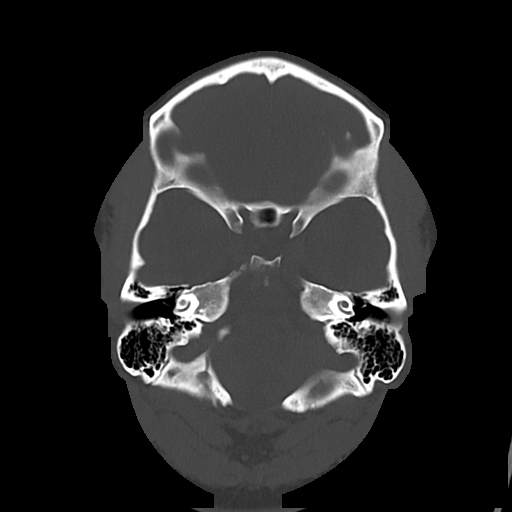
[im 24/78  bone]
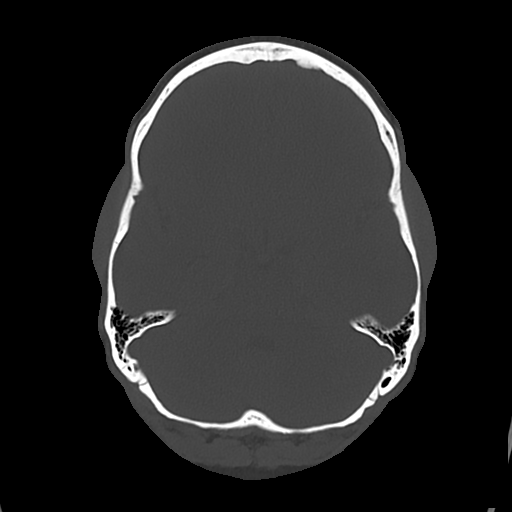

[Series 4: cor soft · coronal · 0.29mm/px · 3 of 61 slices shown]
[im 21/61  brain]
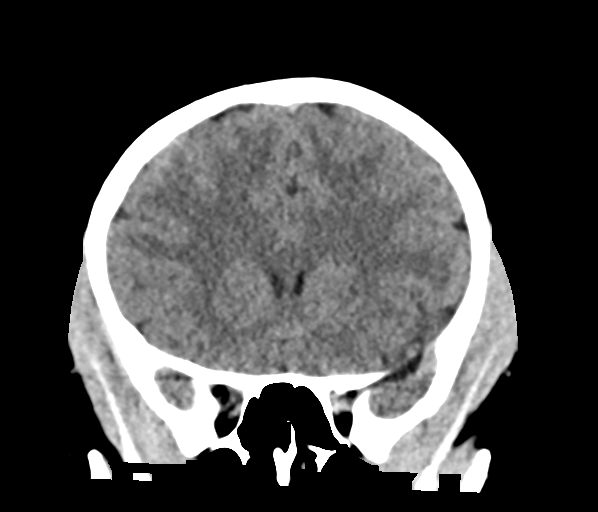
[im 27/61  brain]
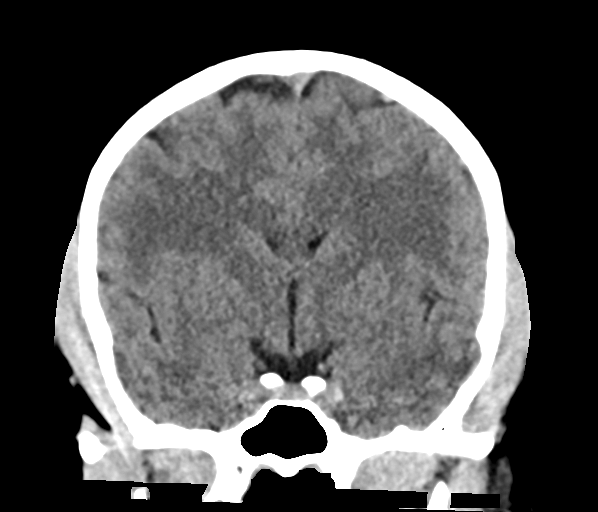
[im 34/61  brain]
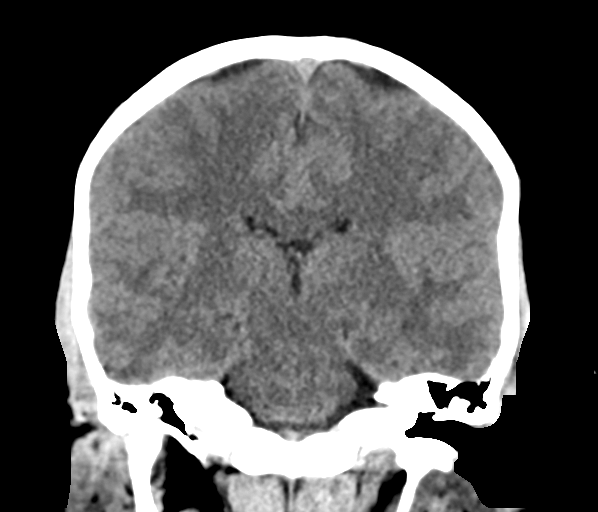

[Series 5: sag soft · sagittal · 0.29mm/px · 3 of 58 slices shown]
[im 20/58  brain]
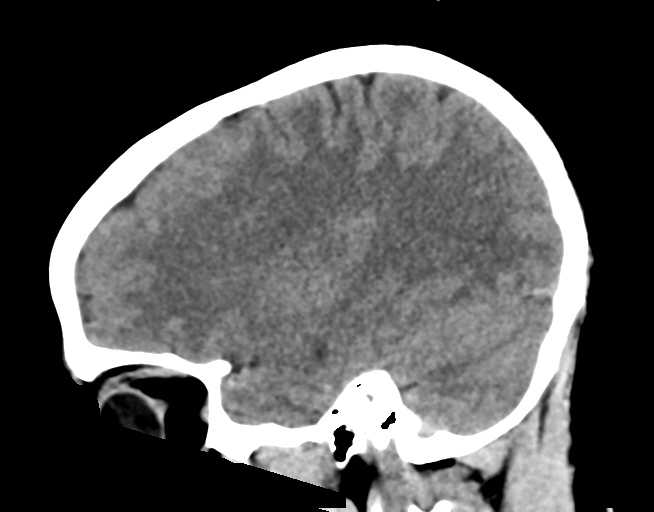
[im 29/58  brain]
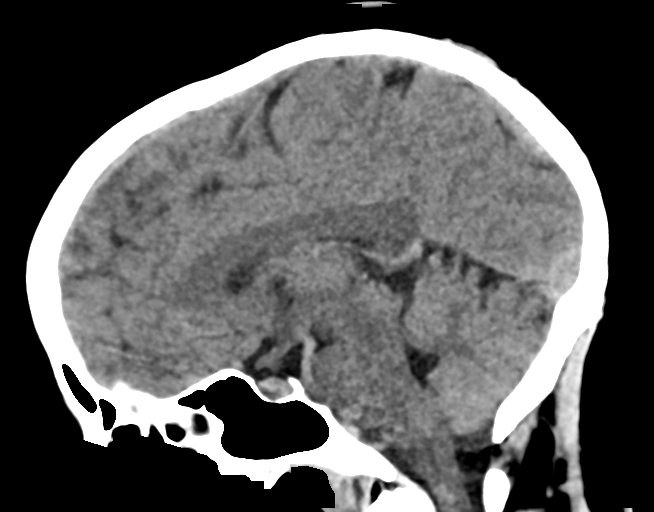
[im 39/58  brain]
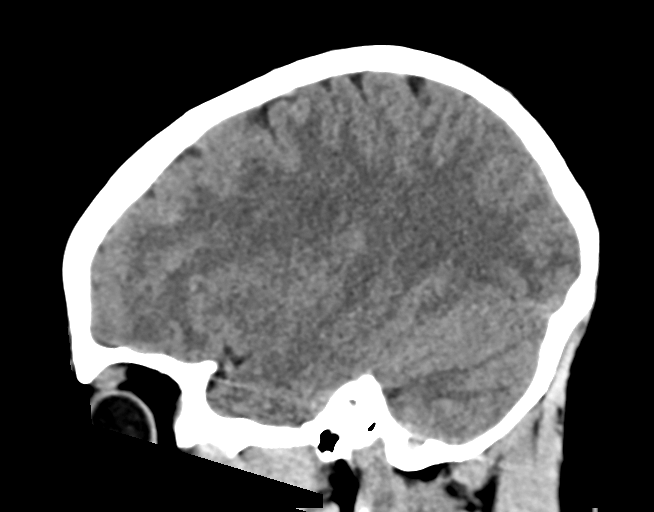

[16 of 47 positions shown; findings below may reference images not displayed]

FINDINGS: Brain: No evidence of acute infarction, hemorrhage, hydrocephalus,
extra-axial collection or mass lesion/mass effect.

Vascular: No hyperdense vessel or unexpected calcification.

Skull: Normal. Negative for fracture or focal lesion.

Sinuses/Orbits: No acute finding.

Other: None.
IMPRESSION: No acute intracranial pathology.

## 2022-11-23 ENCOUNTER — Encounter: Payer: Medicaid Other | Admitting: Obstetrics and Gynecology

## 2022-11-23 DIAGNOSIS — Z01419 Encounter for gynecological examination (general) (routine) without abnormal findings: Secondary | ICD-10-CM | POA: Insufficient documentation

## 2022-11-23 NOTE — Progress Notes (Deleted)
27 y.o. G78P1011 female here for annual exam.   No LMP recorded. (Menstrual status: IUD).    Abnormal bleeding: *** Pelvic discharge or pain: *** Breast mass, nipple discharge or skin changes : *** Birth control: *** Last PAP:     Component Value Date/Time   DIAGPAP  11/13/2019 1609    - Negative for intraepithelial lesion or malignancy (NILM)   ADEQPAP  11/13/2019 1609    Satisfactory for evaluation; transformation zone component PRESENT.   Gardasil: *** Sexually active: ***  Exercising: ***  GYN HISTORY: ***  OB History  Gravida Para Term Preterm AB Living  3 1 1   1 1   SAB IAB Ectopic Multiple Live Births               # Outcome Date GA Lbr Len/2nd Weight Sex Type Anes PTL Lv  3 Gravida           2 AB           1 Term             Past Medical History:  Diagnosis Date   Anemia    Anxiety    Depression    Headache    Vaginal Pap smear, abnormal     Past Surgical History:  Procedure Laterality Date   CESAREAN SECTION      Current Outpatient Medications on File Prior to Visit  Medication Sig Dispense Refill   acetaminophen (TYLENOL) 500 MG tablet Take 2 tablets (1,000 mg total) by mouth every 6 (six) hours as needed for headache. 30 tablet 0   butalbital-acetaminophen-caffeine (FIORICET) 50-325-40 MG tablet Take 1 tablet by mouth every 4 (four) hours as needed.     hydrOXYzine (ATARAX/VISTARIL) 25 MG tablet Take 25 mg by mouth every 6 (six) hours as needed.     ondansetron (ZOFRAN) 4 MG tablet Take 1 tablet (4 mg total) by mouth every 8 (eight) hours as needed for nausea or vomiting. 12 tablet 0   predniSONE (STERAPRED UNI-PAK 21 TAB) 10 MG (21) TBPK tablet Take by mouth daily. Take 6 tabs by mouth daily  for 2 days, then 5 tabs for 2 days, then 4 tabs for 2 days, then 3 tabs for 2 days, 2 tabs for 2 days, then 1 tab by mouth daily for 2 days 42 tablet 0   [DISCONTINUED] dicyclomine (BENTYL) 20 MG tablet Take 1 tablet (20 mg total) by mouth 2 (two) times  daily. (Patient not taking: Reported on 11/13/2019) 20 tablet 0   [DISCONTINUED] metoCLOPramide (REGLAN) 10 MG tablet Take 1 tablet (10 mg total) by mouth every 6 (six) hours as needed for nausea (nausea/headache). (Patient not taking: Reported on 11/13/2019) 10 tablet 0   No current facility-administered medications on file prior to visit.    Social History   Socioeconomic History   Marital status: Single    Spouse name: Not on file   Number of children: 1   Years of education: Not on file   Highest education level: Not on file  Occupational History   Occupation: [employer]  Tobacco Use   Smoking status: Never   Smokeless tobacco: Never  Vaping Use   Vaping status: Some Days  Substance and Sexual Activity   Alcohol use: Yes   Drug use: Not Currently   Sexual activity: Yes    Birth control/protection: I.U.D.  Other Topics Concern   Not on file  Social History Narrative   Not on file   Social  Determinants of Health   Financial Resource Strain: Not on file  Food Insecurity: Not on file  Transportation Needs: Not on file  Physical Activity: Not on file  Stress: Not on file  Social Connections: Unknown (11/08/2022)   Received from [facility]   Social Network    Social Network: Not on file  Intimate Partner Violence: Unknown (11/08/2022)   Received from [facility]   HITS    Physically Hurt: Not on file    Insult or Talk Down To: Not on file    Threaten Physical Harm: Not on file    Scream or Curse: Not on file    Family History  Problem Relation Age of Onset   Diabetes Mother    Hypertension Mother    Cancer Maternal Grandmother    Diabetes Maternal Grandmother    Hearing loss Maternal Grandfather     Allergies  Allergen Reactions   Sulfamethoxazole-Trimethoprim Diarrhea   Sulfa Antibiotics Rash    Other reaction(s): gi distress       PE There were no vitals filed for this visit. There is no height or weight on file to calculate  BMI.  Physical Exam Vitals reviewed. Exam conducted with a chaperone present.  Constitutional:      General: She is not in acute distress.    Appearance: Normal appearance.  HENT:     Head: Normocephalic and atraumatic.     Nose: Nose normal.  Eyes:     Extraocular Movements: Extraocular movements intact.     Conjunctiva/sclera: Conjunctivae normal.  Neck:     Thyroid: No thyroid mass, thyromegaly or thyroid tenderness.  Pulmonary:     Effort: Pulmonary effort is normal.  Chest:     Chest wall: No mass or tenderness.  Breasts:    Right: Normal. No swelling, mass, nipple discharge, skin change or tenderness.     Left: Normal. No swelling, mass, nipple discharge, skin change or tenderness.  Abdominal:     General: There is no distension.     Palpations: Abdomen is soft.     Tenderness: There is no abdominal tenderness.  Genitourinary:    General: Normal vulva.     Exam position: Lithotomy position.     Urethra: No prolapse.     Vagina: Normal. No vaginal discharge or bleeding.     Cervix: Normal. No cervical motion tenderness, discharge or lesion.     Uterus: Normal. Not enlarged and not tender.      Adnexa: Right adnexa normal and left adnexa normal.     Comments: IUD strings present. Musculoskeletal:        General: Normal range of motion.     Cervical back: Normal range of motion.  Lymphadenopathy:     Upper Body:     Right upper body: No axillary adenopathy.     Left upper body: No axillary adenopathy.     Lower Body: No right inguinal adenopathy. No left inguinal adenopathy.  Skin:    General: Skin is warm and dry.  Neurological:     General: No focal deficit present.     Mental Status: She is alert.  Psychiatric:        Mood and Affect: Mood normal.        Behavior: Behavior normal.       Assessment and Plan:        Well woman exam with routine gynecological exam  Uses hormone releasing intrauterine device (IUD) for contraception  Cervical cancer  screening  Rosalyn Gess, MD
